# Patient Record
Sex: Female | Born: 1970 | Race: White | Hispanic: Yes | Smoking: Never smoker
Health system: Southern US, Community
[De-identification: ages and names within clinical notes are randomized; demographics above are authoritative.]

## PROBLEM LIST (undated history)

## (undated) HISTORY — PX: TUBAL LIGATION: SHX77

---

## 2000-08-12 ENCOUNTER — Ambulatory Visit (HOSPITAL_COMMUNITY): Admission: RE | Admit: 2000-08-12 | Discharge: 2000-08-12 | Payer: Self-pay | Admitting: *Deleted

## 2000-11-14 ENCOUNTER — Inpatient Hospital Stay (HOSPITAL_COMMUNITY): Admission: AD | Admit: 2000-11-14 | Discharge: 2000-11-14 | Payer: Self-pay | Admitting: *Deleted

## 2000-11-26 ENCOUNTER — Inpatient Hospital Stay (HOSPITAL_COMMUNITY): Admission: AD | Admit: 2000-11-26 | Discharge: 2000-11-26 | Payer: Self-pay | Admitting: Obstetrics & Gynecology

## 2000-12-24 ENCOUNTER — Encounter (HOSPITAL_COMMUNITY): Admission: RE | Admit: 2000-12-24 | Discharge: 2000-12-30 | Payer: Self-pay | Admitting: Obstetrics & Gynecology

## 2000-12-29 ENCOUNTER — Inpatient Hospital Stay (HOSPITAL_COMMUNITY): Admission: AD | Admit: 2000-12-29 | Discharge: 2001-01-02 | Payer: Self-pay | Admitting: Obstetrics

## 2000-12-29 ENCOUNTER — Encounter (INDEPENDENT_AMBULATORY_CARE_PROVIDER_SITE_OTHER): Payer: Self-pay

## 2001-01-10 ENCOUNTER — Inpatient Hospital Stay (HOSPITAL_COMMUNITY): Admission: AD | Admit: 2001-01-10 | Discharge: 2001-01-10 | Payer: Self-pay | Admitting: Obstetrics

## 2005-02-18 ENCOUNTER — Encounter: Admission: RE | Admit: 2005-02-18 | Discharge: 2005-02-25 | Payer: Self-pay | Admitting: *Deleted

## 2005-05-02 ENCOUNTER — Ambulatory Visit: Payer: Self-pay | Admitting: *Deleted

## 2005-05-10 ENCOUNTER — Ambulatory Visit: Payer: Self-pay | Admitting: Obstetrics and Gynecology

## 2005-05-10 ENCOUNTER — Inpatient Hospital Stay (HOSPITAL_COMMUNITY): Admission: AD | Admit: 2005-05-10 | Discharge: 2005-05-10 | Payer: Self-pay | Admitting: *Deleted

## 2005-05-13 ENCOUNTER — Inpatient Hospital Stay (HOSPITAL_COMMUNITY): Admission: AD | Admit: 2005-05-13 | Discharge: 2005-05-16 | Payer: Self-pay | Admitting: *Deleted

## 2005-05-13 ENCOUNTER — Encounter (INDEPENDENT_AMBULATORY_CARE_PROVIDER_SITE_OTHER): Payer: Self-pay | Admitting: *Deleted

## 2005-05-13 ENCOUNTER — Ambulatory Visit: Payer: Self-pay | Admitting: *Deleted

## 2010-05-24 ENCOUNTER — Other Ambulatory Visit: Admission: RE | Admit: 2010-05-24 | Discharge: 2010-05-24 | Payer: Self-pay | Admitting: Gynecology

## 2010-05-24 ENCOUNTER — Ambulatory Visit: Payer: Self-pay | Admitting: Gynecology

## 2010-06-06 ENCOUNTER — Encounter: Admission: RE | Admit: 2010-06-06 | Discharge: 2010-06-06 | Payer: Self-pay | Admitting: Gynecology

## 2010-08-22 ENCOUNTER — Emergency Department (HOSPITAL_COMMUNITY)
Admission: EM | Admit: 2010-08-22 | Discharge: 2010-08-22 | Disposition: A | Discharge status: Home / Self Care | Payer: Managed Care, Other (non HMO) | Attending: Emergency Medicine | Admitting: Emergency Medicine

## 2010-08-22 DIAGNOSIS — K219 Gastro-esophageal reflux disease without esophagitis: Secondary | ICD-10-CM | POA: Insufficient documentation

## 2010-08-22 DIAGNOSIS — R141 Gas pain: Secondary | ICD-10-CM | POA: Insufficient documentation

## 2010-08-22 DIAGNOSIS — R1110 Vomiting, unspecified: Secondary | ICD-10-CM | POA: Insufficient documentation

## 2010-08-22 DIAGNOSIS — R142 Eructation: Secondary | ICD-10-CM | POA: Insufficient documentation

## 2010-08-22 DIAGNOSIS — R1033 Periumbilical pain: Secondary | ICD-10-CM | POA: Insufficient documentation

## 2010-08-22 LAB — CBC
HCT: 29.6 % — ABNORMAL LOW (ref 36.0–46.0)
Hemoglobin: 9.5 g/dL — ABNORMAL LOW (ref 12.0–15.0)
MCH: 24.7 pg — ABNORMAL LOW (ref 26.0–34.0)
MCHC: 32.1 g/dL (ref 30.0–36.0)
MCV: 76.9 fL — ABNORMAL LOW (ref 78.0–100.0)
Platelets: 227 K/uL (ref 150–400)
RBC: 3.85 MIL/uL — ABNORMAL LOW (ref 3.87–5.11)
RDW: 13.4 % (ref 11.5–15.5)
WBC: 5.7 K/uL (ref 4.0–10.5)

## 2010-08-22 LAB — BASIC METABOLIC PANEL
BUN: 8 mg/dL (ref 6–23)
Calcium: 8.6 mg/dL (ref 8.4–10.5)
Creatinine, Ser: 0.62 mg/dL (ref 0.4–1.2)
GFR calc non Af Amer: >60 mL/min (ref >60)
Glucose, Bld: 87 mg/dL (ref 70–99)
Sodium: 138 mEq/L (ref 135–145)

## 2010-08-22 LAB — DIFFERENTIAL
Basophils Absolute: 0 10*3/uL (ref 0.0–0.1)
Basophils Relative: 1 % (ref 0–1)
Eosinophils Absolute: 0.1 10*3/uL (ref 0.0–0.7)
Eosinophils Relative: 2 % (ref 0–5)
Lymphocytes Relative: 32 % (ref 12–46)
Lymphs Abs: 1.8 10*3/uL (ref 0.7–4.0)
Monocytes Absolute: 0.5 10*3/uL (ref 0.1–1.0)
Monocytes Relative: 8 % (ref 3–12)
Neutro Abs: 3.3 10*3/uL (ref 1.7–7.7)
Neutrophils Relative %: 57 % (ref 43–77)

## 2010-08-22 LAB — URINALYSIS, ROUTINE W REFLEX MICROSCOPIC
Bilirubin Urine: NEGATIVE
Ketones, ur: NEGATIVE mg/dL
Leukocytes, UA: NEGATIVE
Nitrite: NEGATIVE
Protein, ur: NEGATIVE mg/dL
Specific Gravity, Urine: 1.011 (ref 1.005–1.030)
Urine Glucose, Fasting: NEGATIVE mg/dL
Urobilinogen, UA: 0.2 mg/dL (ref 0.0–1.0)
pH: 6.5 (ref 5.0–8.0)

## 2010-08-22 LAB — POCT PREGNANCY, URINE: Preg Test, Ur: NEGATIVE

## 2010-08-22 LAB — URINE MICROSCOPIC-ADD ON

## 2010-08-30 ENCOUNTER — Other Ambulatory Visit: Payer: Self-pay | Admitting: Gastroenterology

## 2010-09-04 ENCOUNTER — Other Ambulatory Visit: Payer: Managed Care, Other (non HMO)

## 2010-09-05 ENCOUNTER — Ambulatory Visit
Admission: RE | Admit: 2010-09-05 | Discharge: 2010-09-05 | Disposition: A | Discharge status: Home / Self Care | Payer: Managed Care, Other (non HMO) | Source: Ambulatory Visit | Attending: Gastroenterology | Admitting: Gastroenterology

## 2010-09-06 ENCOUNTER — Other Ambulatory Visit: Payer: Self-pay | Admitting: Gastroenterology

## 2010-09-11 ENCOUNTER — Ambulatory Visit
Admission: RE | Admit: 2010-09-11 | Discharge: 2010-09-11 | Disposition: A | Discharge status: Home / Self Care | Payer: Managed Care, Other (non HMO) | Source: Ambulatory Visit | Attending: Gastroenterology | Admitting: Gastroenterology

## 2010-09-11 MED ORDER — IOHEXOL 300 MG/ML  SOLN
100.0000 mL | Freq: Once | INTRAMUSCULAR | Status: AC | PRN
  Administered 2010-09-11: 100 mL via INTRAVENOUS

## 2010-11-22 NOTE — Discharge Summary (Signed)
[facility]  Patient:    Yvonne Romero, Yvonne Romero                         MRN: [id number] Adm. Date:  [id number] Disc. Date: [id number] Attending:  Tammi Sou Dictator:   Marvis Moeller, CHM                           Discharge Summary  ADMISSION DIAGNOSES:          Prima gravida at 66 3/7 weeks intrauterine pregnancy.  DISCHARGE DIAGNOSES:          Primary low transverse cesarean section, viable female weighing 8 pounds 11 ounces, Apgars 8/1 and 9/5.  HISTORY:                      This is a 40 year old prima gravida who came in with question of leakage of fluid.  However, she did not report a gush.  Her contractions were nonpainful and mild.  [facility] and was receiving fetal assessment tests for post dates pregnancy.  Her pregnancy course was essentially uncomplicated.  Of note, her one hour glucola was 103. Her GBS was negative.  Her cultures for GC and chlamydia were both negative. Her baseline hemoglobin was 12.5.  She was O+ blood type.  PAST OBSTETRICAL HISTORY:     Prima gravida.  PAST GYNECOLOGICAL HISTORY:   Negative history for STIs.  PAST MEDICAL HISTORY:         Noncontributory.  FAMILY HISTORY:               Noncontributory.  SOCIAL HISTORY:               Non-English speaking.  Education only up to fourth grade.  Unemployed.  Married and has social support.  Negative tobacco, alcohol, drugs.  PHYSICAL EXAMINATION  VITAL SIGNS:                  Patient was noted to be afebrile with vital signs stable.  ABDOMEN:                      Gravid, soft, nontender.  PELVIC:                       Cervix was 2, 80%, -2, and vertex per Dr. Clearance Coots.  SKIN:                         Of note, she had a mottled rash on her arms and groin.  This was pruritic.  LABORATORIES:                 Nitrazine and fern testing were both negative.  HOSPITAL COURSE:              It was felt that she was in latent phase labor and  the decision was made to admit for Pitocin augmentation/induction of labor.  She was also given IV Solu-Medrol to be followed by an oral steroid taper for a rash which was thought to be PUPPPS.  Active stage of labor was somewhat protracted on Pitocin up to about 5 mg.  The patient did receive an epidural when she was approximately 6 cm.  She had an IUPC and scalp lead placed.  Positioning was felt to be ROP.  Amniotic fluid was clear following AROM which was done by Dr. Cyndia Bent when she was 3 cm.  She remained comfortable.  Fetal heart rate was reassuring with a negative CST.  Her active phase progress was fairly slow and this was attributed to her ROP positioning, the estimated fetal weight of 8 pounds, and the questionable adequacy of labor measured by [place] units which were 180 through much of second stage.  Her second stage lasted 3 hours and 20 minutes duration.  Dr. Gavin Potters was consulted when the patient was complete and confirmed her posterior positioning and the plan was discussed at 6 a.m. concerning the second stage protraction versus arrest.  The decision was made to proceed with a primary low transverse cesarean section due to arrest of descent, prolongation of second stage, macrosomia with fetus weighing 8 pounds 11 ounces.  Her postoperative course was normal.  She was given perioperative Unasyn due to her risk of prolonged labor and possible endometritis.  Her PUPPPS rash improved greatly on the Medrol dose pack.  On hospital day #3 she was doing well, ambulatory without orthostatic symptoms.  Lochia was tapering. Breast-feeding well.  Voiding q.s.  Tolerating a regular diet.  She had had flatus.  PHYSICAL EXAMINATION  VITAL SIGNS:                  Temperature was 97.9, pulse 72, blood pressure 124/80.  SKIN:                         Her rash had resolved.  BREASTS:                      Her breasts were filling.  ABDOMEN:                      Soft and appropriately  tender with fundus firm well below umbilicus.  Incision was clean, dry, and intact.  Staples were removed.  LABORATORIES:                 Her admission hemoglobin and hematocrit had been 9.5/32.3.  On discharge her hemoglobin was 8.7, hematocrit 23.6.  RPR was negative.  The placenta was sent for pathology and revealed meconium stained placenta with chorioamnionitis and ______ present.  DISCHARGE MEDICATIONS:        1. Prenatal vitamins one q.d.                               2. Iron supplement one p.o. q.d.                               3. Ibuprofen 600 mg q.h.s. p.r.n. pain.  FOLLOW-UP:                    [facility]  She was to get her definitive [facility] control regimen [facility] and meanwhile was to breast-feed and to use foam or condoms. DD:  02/09/01 TD:  02/09/01 Job: [id number]

## 2010-11-22 NOTE — Discharge Summary (Signed)
NAMEANGELLE, Yvonne Romero                ACCOUNT NO.:  [id number]   MEDICAL RECORD NO.:  [id number]          PATIENT TYPE:  INP   LOCATION:  9109                          FACILITY:  [facility]   PHYSICIAN:  Yvonne Romero, M.D.     DATE OF BIRTH:  1971/03/01   DATE OF ADMISSION:  05/13/2005  DATE OF DISCHARGE:                                 DISCHARGE SUMMARY   REASON FOR ADMISSION:  Scheduled repeat Cesarean section.   DISCHARGE DIAGNOSES:  1.  Status post repeat low transverse Cesarean section and postpartum tubal      ligation.  2.  Gestational diabetes, diet controlled.   LABORATORY DATA:  Hemoglobin 13.7, postoperative day #1 hemoglobin 12.  RPR  nonreactive.   HOSPITAL COURSE:  The patient is  40 year old G2, P1-0-0-1 at 37-4/7 weeks  who presented for scheduled repeat Cesarean section.  She had her first  Cesarean section because the baby had failure to descend and macrosomia.  Repeat Cesarean section was performed without complications.  Please see  operative note for full details. A viable female infant was born with Apgar's  of 8 and 9.  The patient had normal tubes and ovaries.  The postoperative  course was unremarkable.  Pain was well controlled with Percocet.  Hemoglobin was stable.  Staples were discontinued on the day of discharge.   DISPOSITION:  Home in stable condition.   FOLLOW UP:  Six weeks at [facility].   DISCHARGE MEDICATIONS:  1.  Percocet 5 one to two p.o. q.4-6h. PRN.  2.  Ibuprofen 600 mg q.6h. PRN.  3.  Prenatal vitamins one tab p.o. daily.  4.  Simethicone 80 mg one tab p.o. q.6h. PRN bloating.   ACTIVITY:  No heavy lifting X6 weeks.  Nothing per vagina X6 weeks.   DISCHARGE INSTRUCTIONS:  The patient is to return with any signs of  infection including fever, chills, night sweats, increased vaginal bleeding.     ______________________________  Marc Morgans. Mayford Knife, M.D.    ______________________________  Javier Glazier. Okey Romero, M.D.   TLW/MEDQ  D:  05/16/2005   T:  05/16/2005  Job:  [id number]

## 2010-11-22 NOTE — Op Note (Signed)
NAMECHELBI, HERBER                ACCOUNT NO.:  [id number]   MEDICAL RECORD NO.:  [id number]          PATIENT TYPE:  INP   LOCATION:  9109                          FACILITY:  [facility]   PHYSICIAN:  Conni Elliot, M.D.DATE OF BIRTH:  1970/09/02   DATE OF PROCEDURE:  05/13/2005  DATE OF DISCHARGE:                                 OPERATIVE REPORT   PREOPERATIVE DIAGNOSES:  1.  Intrauterine pregnancy, 37-4/7 weeks.  2.  History of previous cesarean section.  3.  Gestational diabetes, diet controlled.  4.  Postpartum tubal ligation.   POSTOPERATIVE DIAGNOSES:  1.  Intrauterine pregnancy, 37-4/7 weeks.  2.  History of previous cesarean section.  3.  Gestational diabetes, diet controlled.  4.  Postpartum tubal ligation.   OPERATION/PROCEDURE:  1.  Repeat low transverse cesarean section via Pfannenstiel.  2.  Postpartum tubal ligation.   SURGEON:  Conni Elliot, M.D.   ASSISTANT:  Marc Morgans. Mayford Knife, M.D.   ANESTHESIA:  Spinal.   COMPLICATIONS:  None.   ESTIMATED BLOOD LOSS:  2900 mL lactated Ringer's.   URINARY OUTPUT:  200 mg of clear urine.   INDICATIONS:  The patient is a 40 year old gravida 2, para 1-0-0-1 at 37-4/7  weeks who presents for a scheduled repeat cesarean section.  She has been  counseled on the risks and benefits of VBAC versus repeat cesarean section  and wanted to proceed with cesarean section, partly because her last baby  she had CPD and failure to progress.  The only weighed in the 6-pound range.  The patient is also a diet-controlled diabetic.   FINDINGS:  Female infant in the cephalic presentation.  NICU was present at  delivery.  Thin meconium.  Apgars 8 and 9.  Normal uterus, tubes and  ovaries.   DESCRIPTION OF PROCEDURE:  The patient was taken to the operating room where  spinal anesthesia was found to be adequate.  She was then prepped and draped  in the normal sterile fashion in the dorsal supine position with a leftward  tilt.  A  Pfannenstiel skin incision was then made with the scalpel and  carried through to the underlying layer of fascia.  The fascia was nicked in  the midline and the incision extended laterally with the Mayo scissors.  The  superior aspect of the fascial incision was then grasped with the Kocher  clamps, elevated and the rectus muscles dissected off using the Mayo  scissors.  Next, the __________  separated and the peritoneal cavity was  entered bluntly and then extended with the Mayo scissors.  The peritoneum  was grasped and the Metzenbaum scissors were used to create a bladder flap.  The lower uterine incised in transverse fashion with the scalpel and  extended out laterally manually with the bandage scissors.  Bladder blade  was removed and the infant's head and body delivered atraumatically.  The  nose and mouth were DeLee suctioned. When the membranes were ruptured, there  was noted to be light meconium.  Cord was clamped and cut and the infant was  handed to the waiting neonatologist.  The placenta was removed manually by  massaging the external side of the uterus.  The uterus was exteriorized and  cleared of all clots and debris.  The uterus was closed using #1 chromic on  the CT1, then the same layer was also done using the same suture.  There is  excellent hemostasis.  The bladder flap was repaired using 2-0 chromic on a  SH1.  Gutters were cleared of all clots and debris.   Attention was then turned to the fallopian tubes.  The right fallopian tube  was grasped with a Babcock and SH needle was superior to the fallopian tube  and then it was used to tie an appropriate 2 cm knuckle of the fallopian  tube.  Next, the same suture was used to tie just inferior to the same  suture. Metzenbaum scissors were then used to excise that portion of the  fallopian tube.  Excellent hemostasis was done and was noted on that side.  Attention was then turned to the left side and in similar fashion.   All the  same steps were performed without difficulty.   The uterus was then returned to the abdominal cavity.  Once again gutters  were cleared of all clots and there was excellent hemostasis noted.  The  fascia was reapproximated.  The peritoneum was closed with 2-0 chromic in a  running fashion.  The fascia was closed with 0 Vicryl.  Next, the  subcutaneous tissue was closed with 0 plain.  The skin was closed with  staples.   The patient tolerated the procedure well.  Sponge, lap and needle counts  were correct x2.  Antibiotics were given at cord clamp.  The patient was  taken to the recovery room in stable condition.  There were no  complications.     ______________________________  Marc Morgans Mayford Knife, M.D.    ______________________________  Conni Elliot, M.D.    TLW/MEDQ  D:  05/14/2005  T:  05/15/2005  Job:  [id number]

## 2010-11-22 NOTE — Op Note (Signed)
[facility]  Patient:    Yvonne Romero, Yvonne Romero                         MRN: [id number] Proc. Date: 12/30/00 Adm. Date:  [id number] Attending:  Tammi Sou Dictator:   Jamey Reas, M.D.                           Operative Report  DATE OF BIRTH:                05-11-1971  PREOPERATIVE DIAGNOSES:       1. Term intrauterine pregnancy.                               2. Failed induction.                               3. Arrestive descent.                               4. Chorioamnionitis.                               5. Prolonged second stage of labor.  POSTOPERATIVE DIAGNOSES:      1. Term intrauterine pregnancy.                               2. Failed induction.                               3. Arrestive descent.                               4. Chorioamnionitis.                               5. Prolonged second stage of labor.  OPERATION:                    Primary low transverse cesarean section via                               Pfannenstiel.  SURGEON:                      Conni Elliot, M.D.  ASSISTANT:                    Jamey Reas, M.D.  ANESTHESIA:  FINDINGS:                     Female infant, cephalic presentation, OP position, clear fluid.  Apgars 8 at one minute and 9 at five minutes.  Cord pH 7.27. Weight 8 pounds and 11 ounces.  IV FLUIDS:                    2600 cc.  ESTIMATED BLOOD LOSS:         600 cc.  URINE OUTPUT:                 350 cc.  COMPLICATIONS:                None.  CONDITION:                    Stable.  INDICATIONS:                  A 40 year old gravida 1, para 0, at 40 weeks, induced for post dates and favorable cervix.  Maximum dilatation complete.  DESCRIPTION OF PROCEDURE:     The patient was taken to the operating room where epidural anesthesia was found to be adequate.  She was then prepped and draped in a normal sterile fashion in the dorsal supine position with  leftward tilt.  A Pfannenstiel skin incision was then made with the scalpel and carried through to the underlying layer of fascia.  The fascia was incised in the midline and the incision extended laterally with Mayo scissors.  The superior aspect of the fascial incision was then grasped with Kocher clamps, elevated, and the underlying rectus muscles dissected off bluntly.  Attention was then turned to the inferior aspect of this incision which in a similar fashion was grasped, tinted up with Kocher clamps, and the rectus muscles dissected off bluntly.  The rectus muscles were then separated in the midline, the peritoneum identified, tinted up, and entered sharply with Metzenbaum scissors.  The peritoneal incision was then extended superiorly and inferiorly with good visualization of the bladder.  The bladder blade was then inserted and the vesicouterine peritoneum identified and grasped with pickups and entered sharply with Metzenbaum scissors.  This incision was then extended laterally and the bladder flap created digitally.  The bladder blade was then reinserted and the lower uterine segment incised in a transverse fashion with the scalpel.  The uterine incision was then extended laterally with blunt dissection.  The bladder blade was removed and the infants head delivered atraumatically.  The nose and mouth were suctioned with bulb suction and cord clamped and cut.  The infant was handed off to awaiting pediatricians.  Cord gases were sent.  The placenta was then removed manually, the uterus exteriorized, and cleared of all clots and debris.  The uterine incision was repaired with 1-0 chromic in a running locked fashion.  A second layer of the same suture was used to obtain excellent hemostasis.  The bladder flap was repaired with 3-0 Vicryl in a running stitch, and the uterus returned to the abdomen.  The gutters were cleared of all clots and debris.  The peritoneum closed with 2-0  chromic.  The fascia was reapproximated with 3-0 Vicryl in a running fashion.  The skin was closed with staples.  The patient tolerated the procedure well.  Sponge, lap, and needle counts were correct x 2.  ________ had been given prior to surgery.  The patient was taken to the recovery room in stable condition. DD:  12/30/00 TD:  12/30/00 Job: [id number]

## 2011-07-14 ENCOUNTER — Encounter: Payer: Self-pay | Admitting: Emergency Medicine

## 2011-07-14 ENCOUNTER — Emergency Department (HOSPITAL_COMMUNITY)
Admission: EM | Admit: 2011-07-14 | Discharge: 2011-07-14 | Disposition: A | Discharge status: Home / Self Care | Payer: Managed Care, Other (non HMO) | Attending: Emergency Medicine | Admitting: Emergency Medicine

## 2011-07-14 DIAGNOSIS — S335XXA Sprain of ligaments of lumbar spine, initial encounter: Secondary | ICD-10-CM | POA: Insufficient documentation

## 2011-07-14 DIAGNOSIS — X58XXXA Exposure to other specified factors, initial encounter: Secondary | ICD-10-CM | POA: Insufficient documentation

## 2011-07-14 DIAGNOSIS — M79609 Pain in unspecified limb: Secondary | ICD-10-CM | POA: Insufficient documentation

## 2011-07-14 DIAGNOSIS — M545 Low back pain, unspecified: Secondary | ICD-10-CM | POA: Insufficient documentation

## 2011-07-14 DIAGNOSIS — S39012A Strain of muscle, fascia and tendon of lower back, initial encounter: Secondary | ICD-10-CM

## 2011-07-14 LAB — URINALYSIS, ROUTINE W REFLEX MICROSCOPIC
Bilirubin Urine: NEGATIVE
Glucose, UA: NEGATIVE mg/dL
Hgb urine dipstick: NEGATIVE
Ketones, ur: NEGATIVE mg/dL
Leukocytes, UA: NEGATIVE
Nitrite: NEGATIVE
Protein, ur: NEGATIVE mg/dL
Specific Gravity, Urine: 1.017 (ref 1.005–1.030)
Urobilinogen, UA: 1 mg/dL (ref 0.0–1.0)
pH: 7 (ref 5.0–8.0)

## 2011-07-14 LAB — PREGNANCY, URINE: Preg Test, Ur: NEGATIVE

## 2011-07-14 MED ORDER — ORPHENADRINE CITRATE ER 100 MG PO TB12: 100.0000 mg | ORAL_TABLET | Freq: Two times a day (BID) | ORAL | Status: AC

## 2011-07-14 MED ORDER — NAPROXEN 500 MG PO TABS: 500.0000 mg | ORAL_TABLET | Freq: Two times a day (BID) | ORAL | Status: DC

## 2011-07-14 MED ORDER — OXYCODONE-ACETAMINOPHEN 5-325 MG PO TABS: 1.0000 | ORAL_TABLET | ORAL | Status: AC | PRN

## 2011-07-14 NOTE — ED Provider Notes (Signed)
History     CSN: [id number]  Arrival date & time 07/14/11  1547   First MD Initiated Contact with Patient 07/14/11 1847      Chief Complaint  Patient presents with  . Back Pain    (Consider location/radiation/quality/duration/timing/severity/associated sxs/prior treatment) Patient is a 41 y.o. female presenting with back pain. The history is provided by the patient.  Back Pain   She has been having pain in her lower back for the last 4 days. Pain is dull and achy and severe. She rates pain at 10 out of 10. It is worse with the movement. Nothing makes it feel any better. She's not taken any medication for it. Pain does radiate up to her upper back and somewhat into her left leg. She denies weakness, numbness, tingling. She denies any urinary urgency, frequency, tenesmus, or dysuria. There's been no fever, chills, sweats and she denies nausea or vomiting. Her job does involve a lot of lifting including some awkward lifting. She does not recall a specific injury occurring on the job. She has not had back pain previously.  History reviewed. No pertinent past medical history.  History reviewed. No pertinent past surgical history.  History reviewed. No pertinent family history.  History  Substance Use Topics  . Smoking status: Never Smoker   . Smokeless tobacco: Not on file  . Alcohol Use: No    OB History    Grav Para Term Preterm Abortions TAB SAB Ect Mult Living                  Review of Systems  Musculoskeletal: Positive for back pain.  All other systems reviewed and are negative.    Allergies  Review of patient's allergies indicates no known allergies.  Home Medications  No current outpatient prescriptions on file.  BP 105/70  Pulse 66  Temp(Src) 98.2 F (36.8 C) (Oral)  Resp 18  SpO2 99%  Physical Exam  Nursing note and vitals reviewed.  41 year old female who is resting comfortably and in no acute distress. Vital signs are normal. Oxygen saturation is 99%  which is normal. Head is normocephalic and atraumatic. Oropharynx is clear. Neck is supple without adenopathy. Back has mild tenderness throughout the lumbar area with moderate bilateral paralumbar spasm which is worse on the left. Straight leg raise is negative. Lungs are clear without rales, wheezes, rhonchi. Heart has regular rate and rhythm without murmur. There is no chest wall tenderness. Abdomen is soft, flat, nontender without masses or hepatosplenomegaly. Extremities have no cyanosis or edema, full range of motion present. Skin is warm and moist without rash. Neurologic: Mental status is normal, cranial nerves are intact, there no focal motor or sensory deficits. Psychiatric: No abnormalities of mood or affect.  ED Course  Procedures (including critical care time)   Labs Reviewed  URINALYSIS, ROUTINE W REFLEX MICROSCOPIC  PREGNANCY, URINE   No results found. Results for orders placed during the hospital encounter of 07/14/11  URINALYSIS, ROUTINE W REFLEX MICROSCOPIC      Component Value Range   Color, Urine YELLOW  YELLOW    APPearance HAZY (*) CLEAR    Specific Gravity, Urine 1.017  1.005 - 1.030    pH 7.0  5.0 - 8.0    Glucose, UA NEGATIVE  NEGATIVE (mg/dL)   Hgb urine dipstick NEGATIVE  NEGATIVE    Bilirubin Urine NEGATIVE  NEGATIVE    Ketones, ur NEGATIVE  NEGATIVE (mg/dL)   Protein, ur NEGATIVE  NEGATIVE (mg/dL)   Urobilinogen,  UA 1.0  0.0 - 1.0 (mg/dL)   Nitrite NEGATIVE  NEGATIVE    Leukocytes, UA NEGATIVE  NEGATIVE   PREGNANCY, URINE      Component Value Range   Preg Test, Ur NEGATIVE     No results found.    No diagnosis found.  Impression-musculoskeletal low back pain. She will be sent home with prescriptions for naproxen, Norflex, and Percocet.  MDM  Musculoskeletal back pain. She will be treated with NSAIDs, muscle relaxers, and narcotic pain medication. Urine pregnancy test will be checked prior to prescribing medication.        Dione Booze,  MD 07/14/11 [phone]

## 2011-07-14 NOTE — ED Notes (Signed)
Pt c/o lower back pain x 4 days; pt denies injury or burning with urinatino

## 2012-01-30 IMAGING — CT CT ABD-PEL WO/W CM
2 of 7 series · 14 of 46 positions shown, 19 images · IV contrast (READICAT/WATER & [ID] OMNI 300)
Comparison: 09/05/2010, 06/06/2010

CLINICAL DATA: Abdominal pain.  Kidney stones.  Left-sided
abdominal pain and left-sided back pain.

CT ABDOMEN AND PELVIS WITHOUT AND WITH CONTRAST
TECHNIQUE: Multidetector CT imaging of the abdomen and pelvis was
performed without contrast material in one or both body regions,
followed by contrast material(s) and further sections in one or
both body regions.
Contrast: 100 ml Cmnipaque-KNN.

[Series 4: abdomen w/ · axial · 0.70mm/px · z∈[-353,-23]mm · 11 of 76 slices shown, 16 images]
[im 5/76  soft-tissue]
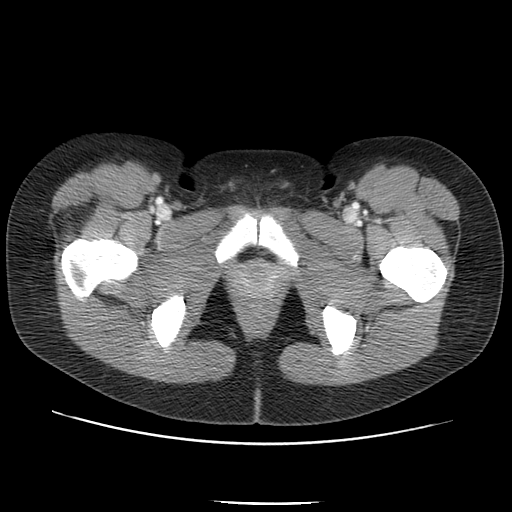
[im 5/76  bone]
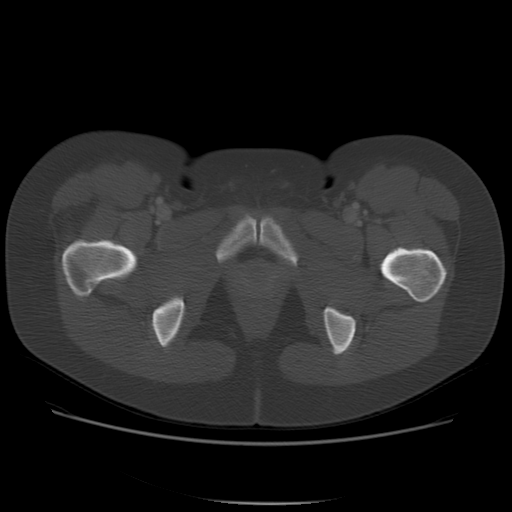
[im 15/76  soft-tissue]
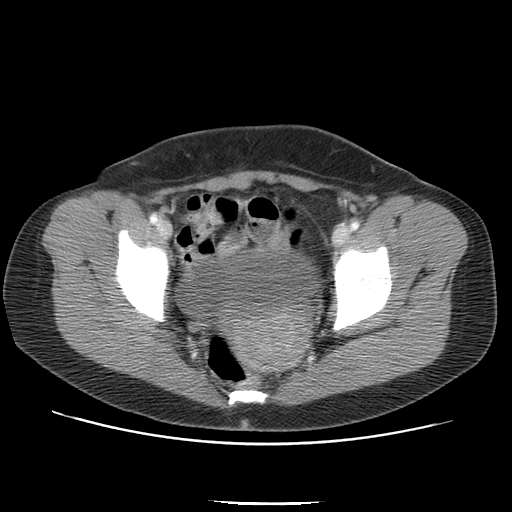
[im 19/76  soft-tissue]
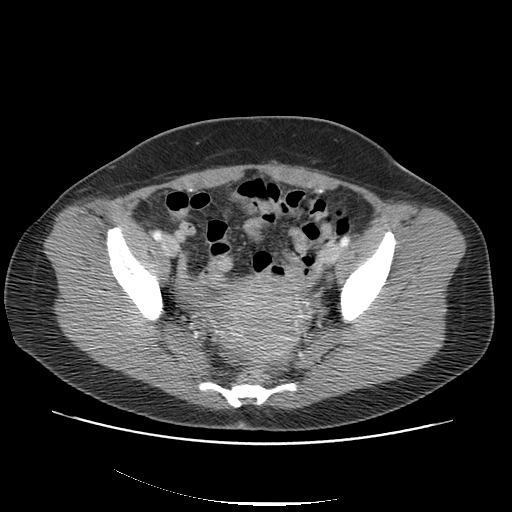
[im 29/76  soft-tissue]
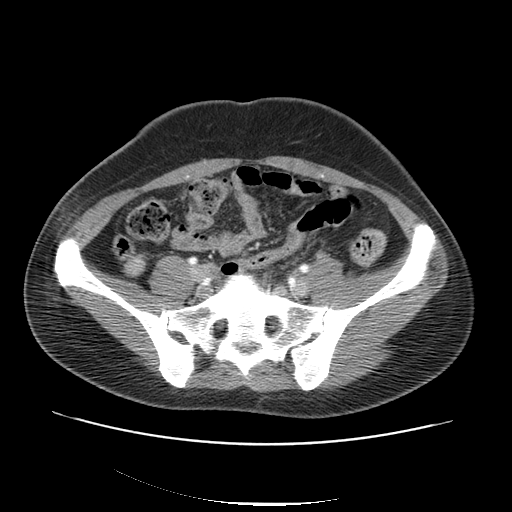
[im 33/76  soft-tissue]
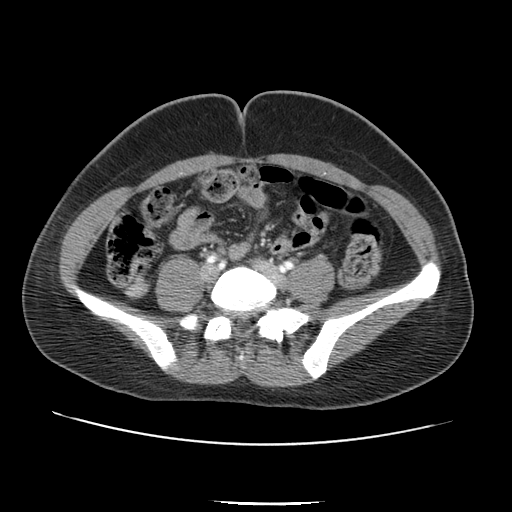
[im 43/76  soft-tissue]
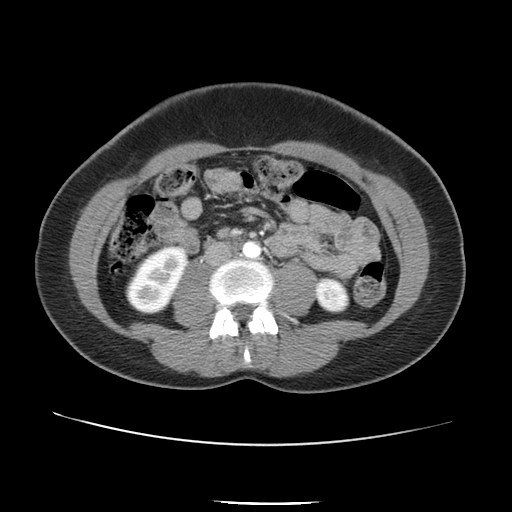
[im 47/76  soft-tissue]
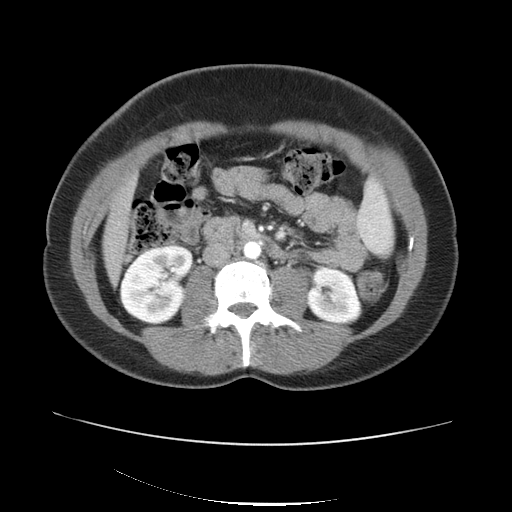
[im 57/76  soft-tissue]
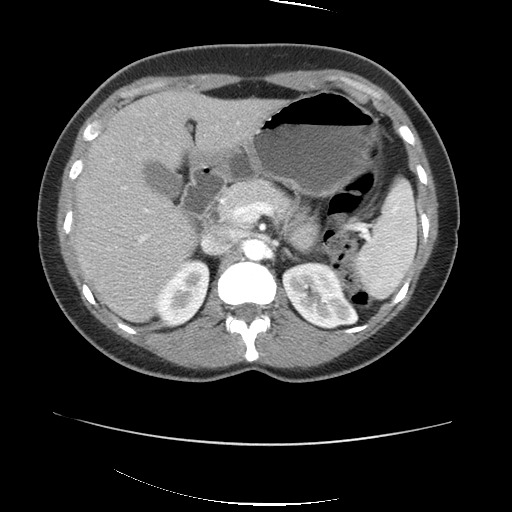
[im 57/76  lung]
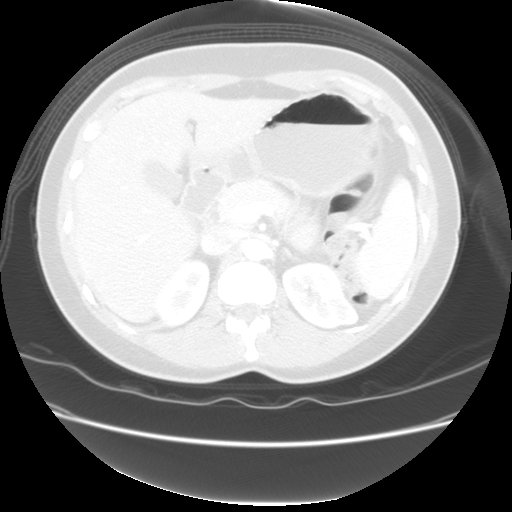
[im 61/76  soft-tissue]
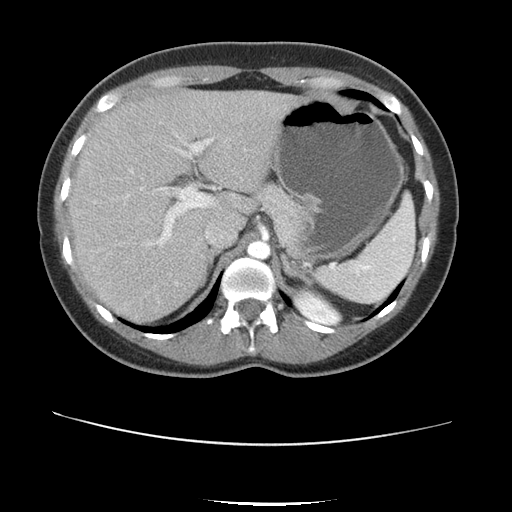
[im 61/76  lung]
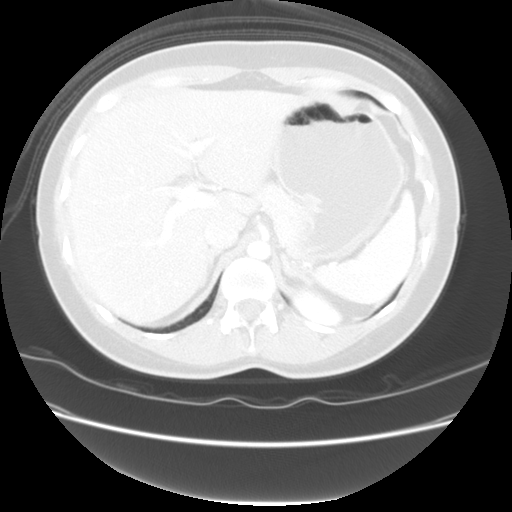
[im 61/76  bone]
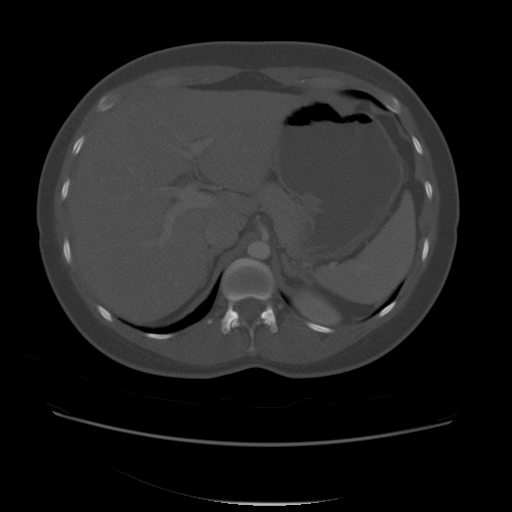
[im 66/76  lung]
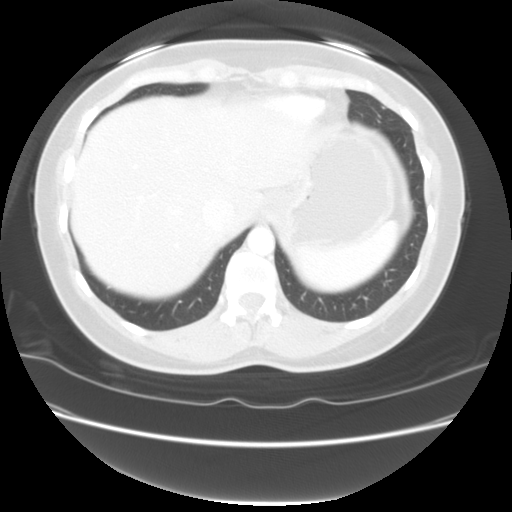
[im 71/76  soft-tissue]
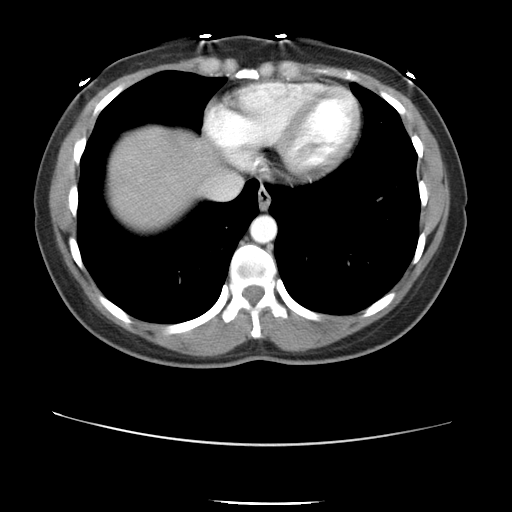
[im 71/76  lung]
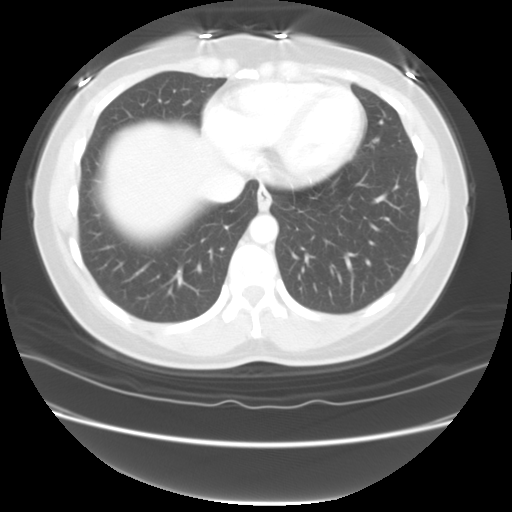

[Series 600: cor w/ cm · coronal · 0.82mm/px · 3 of 104 slices shown]
[im 26/104  soft-tissue]
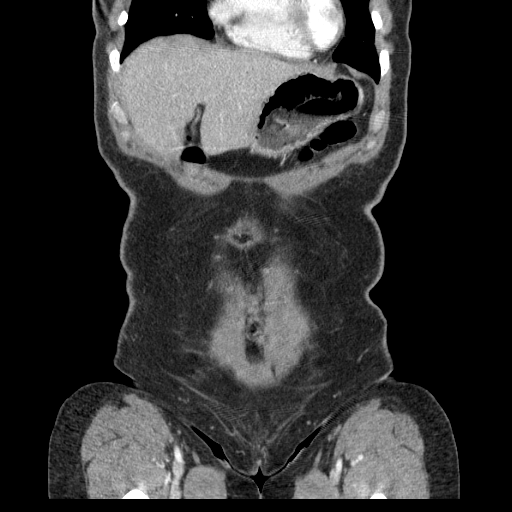
[im 52/104  soft-tissue]
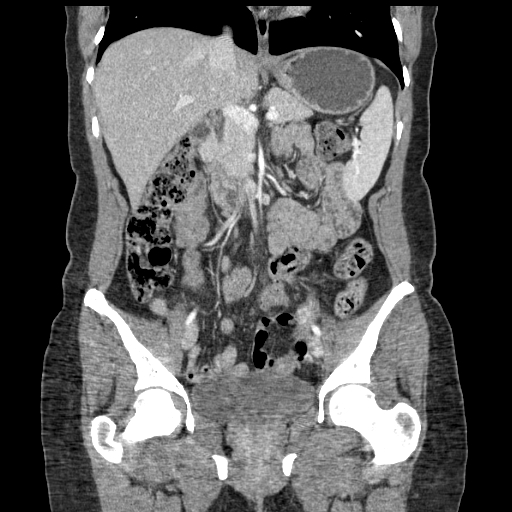
[im 78/104  soft-tissue]
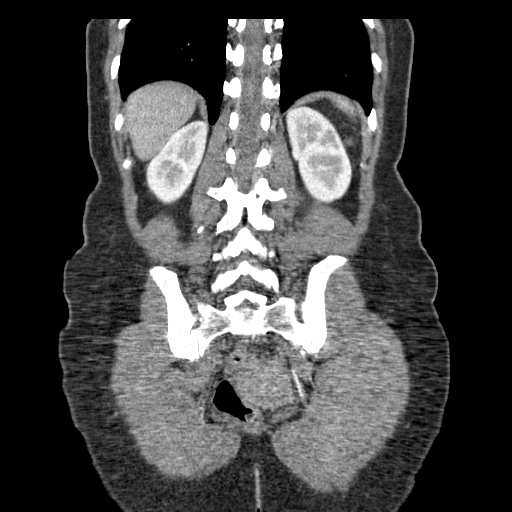

[14 of 46 positions shown; findings below may reference images not displayed]

FINDINGS: The lung bases are clear.  Unenhanced CT demonstrates
three renal calculi within the right collecting system.  Upper pole
calculus measures 3 mm.  Interpolar calculus measures 4 mm and the
inferior pole calculus measures 3 mm.  There are no ureteral
calculi. No left renal calculi are present.  No hydronephrosis.
There is a 2.2 cm simple left interpolar renal cyst.  This appears
unchanged compared to ultrasound 06/06/2010 and 09/05/2010.

The liver appears within normal limits.  Gallbladder is
unremarkable.  Common bile duct and pancreas normal.  Spleen
normal.  Stomach and proximal small bowel within normal limits.
Normal appendix identified adjacent to the cecum.  Physiologic
appearance of the uterus and adnexa. Colon appears normal.  Urinary
bladder appears normal.  Abdominal vasculature is within normal
limits.  Adrenal glands are unremarkable.  Iliofemoral vasculature
appears normal as well.  No lymphadenopathy.  Bones are
unremarkable.
IMPRESSION: 1.  Three nonobstructing right renal collecting system calculi
measuring between 3 and 4 mm each.
2.  Left interpolar simple renal cyst.

## 2012-02-10 ENCOUNTER — Telehealth: Payer: Self-pay | Admitting: *Deleted

## 2012-02-10 ENCOUNTER — Encounter: Payer: Self-pay | Admitting: Gynecology

## 2012-02-10 ENCOUNTER — Ambulatory Visit (INDEPENDENT_AMBULATORY_CARE_PROVIDER_SITE_OTHER): Payer: Managed Care, Other (non HMO) | Admitting: Gynecology

## 2012-02-10 VITALS — BP 118/74 | Ht 60.0 in | Wt 132.0 lb

## 2012-02-10 DIAGNOSIS — R635 Abnormal weight gain: Secondary | ICD-10-CM

## 2012-02-10 DIAGNOSIS — R490 Dysphonia: Secondary | ICD-10-CM

## 2012-02-10 DIAGNOSIS — E079 Disorder of thyroid, unspecified: Secondary | ICD-10-CM

## 2012-02-10 DIAGNOSIS — M542 Cervicalgia: Secondary | ICD-10-CM

## 2012-02-10 DIAGNOSIS — Z01419 Encounter for gynecological examination (general) (routine) without abnormal findings: Secondary | ICD-10-CM

## 2012-02-10 DIAGNOSIS — Z833 Family history of diabetes mellitus: Secondary | ICD-10-CM

## 2012-02-10 LAB — CBC WITH DIFFERENTIAL/PLATELET
Basophils Absolute: 0 10*3/uL (ref 0.0–0.1)
Basophils Relative: 1 % (ref 0–1)
Eosinophils Relative: 1 % (ref 0–5)
HCT: 32.3 % — ABNORMAL LOW (ref 36.0–46.0)
Hemoglobin: 10.6 g/dL — ABNORMAL LOW (ref 12.0–15.0)
MCHC: 32.8 g/dL (ref 30.0–36.0)
MCV: 72.7 fL — ABNORMAL LOW (ref 78.0–100.0)
Monocytes Absolute: 0.5 10*3/uL (ref 0.1–1.0)
Monocytes Relative: 8 % (ref 3–12)
RDW: 15.4 % (ref 11.5–15.5)

## 2012-02-10 LAB — THYROID PANEL WITH TSH
T4, Total: 11.1 ug/dL (ref 5.0–12.5)
TSH: 1.789 u[IU]/mL (ref 0.350–4.500)

## 2012-02-10 LAB — GLUCOSE, RANDOM: Glucose, Bld: 88 mg/dL (ref 70–99)

## 2012-02-10 NOTE — Telephone Encounter (Signed)
[name] CMA is calling to inform patient that appt is set up for 02/12/12 @ 9:15 @ [facility] ([facility]

## 2012-02-10 NOTE — Patient Instructions (Addendum)
Mantenimiento de la salud en las mujeres (Health Maintenance, Females) Un estilo de vida saludable y los cuidados preventivos pueden favorecer la salud y el bienestar.   Haga exmenes regulares de la salud en general, dentales y de los ojos.   Consuma una dieta saludable. Los alimentos como vegetales, frutas, granos enteros, productos lcteos descremados y protenas magras contienen los nutrientes que usted necesita sin necesidad de consumir muchas caloras. Disminuya el consumo de alimentos con alto contenido de grasas slidas, azcar y sal agregadas. Si es necesario, pdaleinformacin acerca de una dieta adecuada a su mdico.   La actividad fsica regular es una de las cosas ms importantes que puede hacer por su salud. Los adultos deben hacer al menos 150 minutos de ejercicios de intensidad moderada (cualquier actividad que aumente la frecuencia cardaca y lo haga transpirar) cada semana. Adems, la mayora de los adultos necesita ejercicios de fortalecimiento muscular 2  ms das por semana.    Mantenga un peso saludable. El ndice de masa corporal (IMC) es una herramienta que identifica posibles problemas con el peso. Proporciona una estimacin de la grasa corporal basndose en el peso y la altura. El mdico podr determinar su IMC y podr ayudarlo a lograr o mantener un peso saludable. Para los adultos de 20 aos o ms:   Un IMC menor a 18,5 se considera bajo peso.   Un IMC entre 18,5 y 24,9 es normal.   Un IMC entre 25 y 29,9 es sobrepeso.   Un IMC entre 30 o ms es obesidad.   Mantenga un nivel normal de lpidos y colesterol en sangre practicando actividad fsica y minimizando la ingesta de grasas saturadas. Consuma una dieta balanceada e incluya variedad de frutas y vegetales. Los anlisis de lpidos y colesterol en sangre deben comenzar a los 20 aos y repetirse cada 5 aos. Si los niveles de colesterol son altos, tiene ms de 50 aos o tiene riesgo elevado de sufrir enfermedades  cardacas, necesitar controlarse con ms frecuencia.Si tiene niveles elevados de lpidos y colesterol, debe recibir tratamiento con medicamentos, si la dieta y el ejercicio no son efectivos.   Si fuma, consulte con el profesional acerca de las opciones para dejar de hacerlo. Si no lo hace, no comience.   Si est embarazada no beba alcohol. Si est amamantando, beba alcohol con prudencia. Si elige beber alcohol, no se exceda de 1 medida por da. Se considera una medida a 12 onzas (355 ml) de cerveza, 5 onzas (148 ml) de vino, o 1,5 onzas (44 ml) de licor.   Evite el alcohol y el consumo de drogas. No comparta agujas. Pida ayuda si necesita asistencia o instrucciones con respecto a abandonar el consumo de alcohol, cigarrillos o drogas.   La hipertensin arterial causa enfermedades cardacas y aumenta el riesgo de ictus. Debe controlar su presin arterial al menos cada 1 o 2 aos. La presin arterial elevada que persiste debe tratarse con medicamentos si la prdida de peso y el ejercicio no son efectivos.   Si tiene entre 55 y 79 aos, consulte a su mdico si debe tomar aspirina para prevenir enfermedades cardacas.   Los anlisis para la diabetes incluyen la toma de una muestra de sangre para controlar el nivel de azcar en la sangre durante el ayuno. Debe hacerlo cada 3 aos despus de los 45 aos si est dentro de su peso normal y sin factores de riesgo para la diabetes. Las pruebas deben comenzar a edades tempranas o llevarse a cabo con ms frecuencia   si tiene sobrepeso y al menos 1 factor de riesgo para la diabetes.   Las evaluaciones para detectar el cncer de mama son un mtodo preventivo fundamental para las mujeres. Debe practicar la "autoconciencia de las mamas". Esto significa que debe reconocer la apariencia normal de sus mamas y como las siente y pudiendo incluir un autoexamen de mamas. Si detecta algn cambio, no importa cun pequeo sea, debe informarlo a su mdico. Las mujeres entre 20 y  40 aos deben hacer un examen clnico de las mamas como parte del examen regular de salud, cada 1 a 3 aos. Despus de los 40 aos deben hacerlo todos los aos. Deben hacerse una mamografa radografa de mamas ) cada ao, comenzando a los 40 aos. Las mujeres con historia familiar de cncer de mama deben hablar con el mdico para hacer un estudio gentico. Las que tienen ms riesgo deben hacerse resonancia magntica y una mamografa todos los aos.   Un test de Pap se realiza para diagnosticar cncer de cuello de tero. Las mujeres deben hacerse un test de Pap a partir de los 21 aos. Entre los 21 y los 29 aos debe repetirse cada dos aos. Luego de los 30 aos, debe realizarse un test de Pap cada tres aos siempre que los 3 estudios anteriores sean normales. Si le han realizado una histerectoma por un problema que no era cncer u otra enfermedad que podra causar cncer, ya no necesitar un test de Pap. Si tiene entre 65 y 70 aos y ha tenido un test de Pap normal en los ltimos 10 aos, ya no ser necesario realizarlo. Si ha recibido un tratamiento para el cncer cervical o para una enfermedad que podra causar cncer, necesitar realizar un test de Pap y controles durante al menos 20 aos de concluir el tratamiento. Si no se ha hecho el examen con regularidad, debern volver a evaluarse los factores de riesgo (como el tener un nuevo compaero sexual) para determinar si debe volver a realizarse los estudios. Algunas mujeres sufren problemas mdicos que aumentan la probabilidad de contraer cncer cervical. En estos casos, el mdico podr indicar que se realice el test de Pap con ms frecuencia.   La prueba del virus del papiloma humano (VPH) es un anlisis adicional que puede usarse para detectar cncer de cuello de tero. Esta prueba busca la presencia del virus que causa los cambios en el cuello. Las clulas que se recolectan durante el test de Pap pueden usarse para el VPH. La prueba para el VPH puede  usarse para evaluar a mujeres de ms de 30 aos y debe usarse en mujeres de cualquier edad cuyos resultados del test de Pap no sean claros. Despus de los 30 aos, las mujeres deben hacerse el anlisis para el VPH con la misma frecuencia que el test de Pap.   El cncer colorectal puede detectarse y con frecuencia puede prevenirse. La mayor parte de los estudios de rutina comienzan a los 50 aos y continan hasta los 75 aos. Sin embargo, el mdico podr aconsejarle que lo haga antes, si tiene factores de riesgo para el cncer de colon. Una vez por ao, el profesional le dar un kit de prueba para hallar sangre oculta en la materia fecal. La utilizacin de un tubo con una pequea cmara en su extremo para examinar directamente el colon (sigmoidoscopa o colonoscopa), puede detectar formas temprana de cncer colorectal. Hable con su mdico si tiene 50 aos, cuando comience con los estudios de rutina. El examen directo del   colon debe repetirse cada 5 a 10 aos, hasta los 75 aos, excepto que se encuentren formas tempranas de plipos precancerosos o pequeos bultos.   Se recomienda realizar un anlisis de sangre para detectar hepatitis C a todas las personas nacidas entre 1945 y 1965, y a todo aquel que tenga un riesgo conocido de haber contrado esta enfermedad.   Practique el sexo seguro. Use condones y evite las prcticas sexuales riesgosas para disminuir el contagio de enfermedades de transmisin sexual. Las mujeres sexualmente activas de 25 aos o menos deben controlarse para descartar clamidia, que es una infeccin de transmisin sexual frecuente. Las mujeres mayores que tengan mltiples compaeros tambin deben hacerse el anlisis para detectar clamidia. Se recomienda realizar anlisis para detectar otras enfermedades de transmisin sexual si es sexualmente activa y tiene riesgos.   La osteoporosis es una enfermedad en la que los huesos pierden los minerales y la fuerza por el avance de la edad. El  resultado pueden ser fracturas graves en los huesos. El riesgo de osteoporosis puede identificarse con una prueba de densidad sea. Las mujeres de ms de 65 aos y las que tengan riesgos de sufrir fracturas u osteoporosis deben pedir consejo a su mdico. Consulte a su mdico si debe tomar un suplemento de calcio o de vitamina D para reducir el riesgo de osteoporosis.   La menopausia se asocia a sntomas y riesgos fsicos. Se dispone de una terapia de reemplazo hormonal para disminuir los sntomas y los riesgos. Consulte a su mdico para saber si la terapia de reemplazo hormonal es conveniente para usted.   Use una pantalla solar con un factor SPF de 30 o mayor. Aplique pantalla de manera libre y repetida a lo largo del da. Pngase al resguardo del sol cuando la sombra sea ms pequea que usted. Protjase usando mangas y pantalones largos, un sombrero de ala ancha y gafas para el sol todo el ao, siempre que se encuentre en el exterior.   Informe a su mdico si aparecen nuevos lunares o los que tiene se modifican, especialmente en forma y color. Tambin notifique al mdico si un lunar es ms grande que el tamao de una goma de lpiz.   Mantngase al da con las vacunas.  Document Released: 06/12/2011 [employer] Patient Information 2012 [employer]. 

## 2012-02-10 NOTE — Telephone Encounter (Signed)
Message copied by Libby Maw on Tue Feb 10, 2012 11:57 AM ------      Message from: Ok Edwards      Created: Tue Feb 10, 2012 11:26 AM       Please schedule ultrasound in this patient with left sided neck mass and hoarseness.Order has been placed in Epic

## 2012-02-10 NOTE — Progress Notes (Signed)
Yvonne Romero 1971/01/19 [id number]   History:    41 y.o.  for annual gyn exam who for the past 3 weeks has been complaining of hoarseness and a questionable mass in her neck. She denies any coughing. She works at a [employer] and wears no mask. Patient with prior tubal sterilization procedure. Patient separated from her partner and is not sexually active. She reports normal menstrual cycles. Her last Pap smear was normal in 2011. In 2011 also she was diagnosed with H. pylori and treated and subsequently was followed by the gastroenterologist Dr.Hung who had done her upper endoscopy and it was reported to be normal. Review of her record indicates she was weighing 132 is down to 129.  Past medical history,surgical history, family history and social history were all reviewed and documented in the EPIC chart.  Gynecologic History Patient's last menstrual period was 01/26/2012. Contraception: tubal ligation Last Pap: 2011. Results were: normal Last mammogram: Not indicated. Results were: Not indicated  Obstetric History OB History    Grav Para Term Preterm Abortions TAB SAB Ect Mult Living   2 2 2       2      # Outc Date GA Lbr Len/2nd Wgt Sex Del Anes PTL Lv   1 TRM     M CS  No Yes   2 TRM     F CS  No Yes       ROS: A ROS was performed and pertinent positives and negatives are included in the history.  GENERAL: No fevers or chills. HEENT: No change in vision, no earache, sore throat or sinus congestion. NECK: Mass on left side of neck. CARDIOVASCULAR: No chest pain or pressure. No palpitations. PULMONARY: No shortness of breath, cough or wheeze. GASTROINTESTINAL: No abdominal pain, nausea, vomiting or diarrhea, melena or bright red blood per rectum. GENITOURINARY: No urinary frequency, urgency, hesitancy or dysuria. MUSCULOSKELETAL: No joint or muscle pain, no back pain, no recent trauma. DERMATOLOGIC: No rash, no itching, no lesions. ENDOCRINE: No polyuria, polydipsia, no heat  or cold intolerance. No recent change in weight. HEMATOLOGICAL: No anemia or easy bruising or bleeding. NEUROLOGIC: No headache, seizures, numbness, tingling or weakness. PSYCHIATRIC: No depression, no loss of interest in normal activity or change in sleep pattern.     Exam: chaperone present  BP 118/74  Ht 5' (1.524 m)  Wt 132 lb (59.875 kg)  BMI 25.78 kg/m2  LMP 01/26/2012  Body mass index is 25.78 kg/(m^2).  General appearance : Well developed well nourished female. No acute distress HEENT: Neck supple, trachea midline, no carotid bruits, 2-1/2 cm mass on left side of neck lower pole of the thyroid Lungs: Clear to auscultation, no rhonchi or wheezes, or rib retractions  Heart: Regular rate and rhythm, no murmurs or gallops Breast:Examined in sitting and supine position were symmetrical in appearance, no palpable masses or tenderness,  no skin retraction, no nipple inversion, no nipple discharge, no skin discoloration, no axillary or supraclavicular lymphadenopathy Abdomen: no palpable masses or tenderness, no rebound or guarding Extremities: no edema or skin discoloration or tenderness  Pelvic:  Bartholin, Urethra, Skene Glands: Within normal limits             Vagina: No gross lesions or discharge  Cervix: No gross lesions or discharge  Uterus  anteverted, normal size, shape and consistency, non-tender and mobile  Adnexa  Without masses or tenderness  Anus and perineum  normal   Rectovaginal  normal sphincter tone without palpated  masses or tenderness             Hemoccult not done     Assessment/Plan:  41 y.o. female for annual exam who will be sent to the hospital for a neck ultrasound. We'll also order a thyroid function panel. We'll wait for these results and manage and refer accordingly. The following labs were also ordered: Random blood sugar, urinalysis, total cholesterol, CBC. No Pap smear done today, new Pap smear screening guidelines discussed. She was encouraged to do  her monthly self breast examination.    Ok Edwards MD, 12:07 PM 02/10/2012

## 2012-02-11 LAB — URINALYSIS W MICROSCOPIC + REFLEX CULTURE
Casts: NONE SEEN
Crystals: NONE SEEN
Glucose, UA: NEGATIVE mg/dL
Hgb urine dipstick: NEGATIVE
Ketones, ur: NEGATIVE mg/dL
Leukocytes, UA: NEGATIVE
Nitrite: NEGATIVE
Specific Gravity, Urine: 1.009 (ref 1.005–1.030)
pH: 5.5 (ref 5.0–8.0)

## 2012-02-12 ENCOUNTER — Ambulatory Visit (HOSPITAL_COMMUNITY): Payer: Managed Care, Other (non HMO) | Attending: Gynecology

## 2012-02-25 ENCOUNTER — Ambulatory Visit (HOSPITAL_COMMUNITY)
Admission: RE | Admit: 2012-02-25 | Discharge: 2012-02-25 | Disposition: A | Discharge status: Home / Self Care | Payer: Managed Care, Other (non HMO) | Source: Ambulatory Visit | Attending: Gynecology | Admitting: Gynecology

## 2012-02-25 DIAGNOSIS — E079 Disorder of thyroid, unspecified: Secondary | ICD-10-CM | POA: Insufficient documentation

## 2012-02-25 DIAGNOSIS — M542 Cervicalgia: Secondary | ICD-10-CM | POA: Insufficient documentation

## 2012-05-21 ENCOUNTER — Ambulatory Visit (INDEPENDENT_AMBULATORY_CARE_PROVIDER_SITE_OTHER): Payer: Managed Care, Other (non HMO) | Admitting: Women's Health

## 2012-05-21 ENCOUNTER — Encounter: Payer: Self-pay | Admitting: Women's Health

## 2012-05-21 VITALS — BP 118/70

## 2012-05-21 DIAGNOSIS — R102 Pelvic and perineal pain: Secondary | ICD-10-CM

## 2012-05-21 DIAGNOSIS — N949 Unspecified condition associated with female genital organs and menstrual cycle: Secondary | ICD-10-CM

## 2012-05-21 DIAGNOSIS — N898 Other specified noninflammatory disorders of vagina: Secondary | ICD-10-CM

## 2012-05-21 LAB — WET PREP FOR TRICH, YEAST, CLUE
Clue Cells Wet Prep HPF POC: NONE SEEN
Trich, Wet Prep: NONE SEEN

## 2012-05-21 MED ORDER — FLUCONAZOLE 150 MG PO TABS: 150.0000 mg | ORAL_TABLET | Freq: Once | ORAL | Status: DC

## 2012-05-21 NOTE — Progress Notes (Signed)
Patient ID: Yvonne Romero, female   DOB: 01-08-1971, 41 y.o.   MRN: [id number] Hispanic, [name] interpreted. Presents with complaint of pelvic bloating, vaginal pressure, slight vaginal itching and pulling sensation at umbilicus. Also states her sister has had a problem with ovarian cysts. Not sexually active in years, regular monthly cycle. Denies urinary symptoms, constipation or fever. History of LGA/cesarean deliveries.  Exam: Abdomen soft no rebound or radiation of pain, external genitalia within normal limits, mild vaginal weakness with bearing down, speculum exam scant white discharge no erythema or odor noted, wet prep positive for yeast. Bimanual no CMT slight tenderness on lower right quadrant, no fullness noted.  Abdominal bloating Yeast vaginitis  Plan: Diflucan 150 times one dose, instructed to call if no relief of vaginal itching. Schedule ultrasound after next cycle.

## 2012-05-21 NOTE — Patient Instructions (Addendum)
Vaginitis monilisica (Monilial Vaginitis) La vaginitis es una inflamacin (irritacin, hinchazn) de la vagina y la vulva. Esta no es una enfermedad de transmisin sexual.  CAUSAS Este tipo de vaginitis lo causa un hongo (candida) que normalmente se encuentra en la vagina. El hongo candida se ha desarrollado hasta el punto de ocasionar problemas en el equilibrio qumico. SNTOMAS  Secrecin vaginal espesa y blanca.   Hinchazn, picazn, enrojecimiento e inflamacin de la vagina y en algunos casos de los labios vaginales (vulva).   Ardor o dolor al [employer].   Dolor en las relaciones sexuales.  DIAGNSTICO Los factores que favorecen la vaginitis moniliasica son:  Everlean Patterson de virginidad y postmenopusicas.   Embarazo.   Infecciones.   Sentir cansancio, estar enferma o estresada, especialmente si ya ha sufrido este problema en el pasado.   Diabetes Buen control ayudar a disminur la probabilidad.   Pldoras anticonceptivas   Ropa interior [place].   El uso de espumas de bao, aerosoles femeninos duchas vaginales o tampones con desodorante.   Algunos antibiticos (medicamentos que destruyen grmenes).   Si contrae alguna enfermedad puede sufrir recurrencias espordicas.  TRATAMIENTO El profesional que lo asiste prescribir medicamentos.  Hay diferentes tipos de cremas y supositorios vaginales que tratan especficamente la vaginitis monilisica. Para infecciones por hongos recurrentes, utilice un supositorio o crema en la vagina dos veces por semana, o segn se le indique.   Tambin podrn utilizarse cremas con corticoides o anti monilisicas para la picazn o la irritacin de la vulva. Consulte con el profesional que la asiste.   Si la crema no da resultado, podr aplicarse en la vagina una solucin con azul de metileno.   El consumo de yogur puede prevenir este tipo de vaginitis.  INSTRUCCIONES PARA EL CUIDADO DOMICILIARIO  Tome todos los medicamentos tal como se le  indic.   No mantenga relaciones sexuales hasta que el tratamiento se haya completado, o segn las indicaciones del profesional que la asiste.   Tome baos de asiento tibios.   No se aplique duchas vaginales.   No utilice tampones, especialmente los perfumados.   Use ropa interior de algodn   [employer]ant pantalones ajustados y las medias tipo panty.   Comunique a sus compaeros sexuales que sufre una infeccin por hongos. Ellos deben concurrir para un control mdico si tienen sntomas como una urticaria leve o picazn.   Sus compaeros sexuales deben tratarse tambin si la infeccin es difcil de Pharmacologist.   Practique el sexo seguro - use condones   Algunos medicamentos vaginales ocasionan fallas en los condones de ltex. Los medicamentos vaginales que pueden daar los condones son:   Chiropodist cleocina   Butoconazole (Femstat)   Terconazole (Terazol) supositorios vaginales   Miconazole (Monistat) (es un medicamento de venta libre)  SOLICITE ATENCIN MDICA SI:  Daphane Shepherd tiene una temperatura oral de ms de 102 F (38.9 C).   Si la infeccin empeora luego de 2 [address]   Si la infeccin no mejora luego de 3 [address].   Aparecen ampollas en o alrededor de la vagina.   Si aparece una hemorragia vaginal y no es el momento del perodo.   Siente dolor al [employer].   Presenta problemas intestinales.   Tiene dolor durante las [employer].  Document Released: 04/02/2005 Document Revised: 06/12/2011 [facility] Patient Information 2012 [place], [place].

## 2012-06-14 ENCOUNTER — Other Ambulatory Visit: Payer: Self-pay | Admitting: Gynecology

## 2012-06-14 ENCOUNTER — Encounter: Payer: Self-pay | Admitting: Gynecology

## 2012-06-14 ENCOUNTER — Ambulatory Visit (INDEPENDENT_AMBULATORY_CARE_PROVIDER_SITE_OTHER): Payer: Managed Care, Other (non HMO)

## 2012-06-14 ENCOUNTER — Ambulatory Visit (INDEPENDENT_AMBULATORY_CARE_PROVIDER_SITE_OTHER): Payer: Managed Care, Other (non HMO) | Admitting: Gynecology

## 2012-06-14 VITALS — BP 120/78

## 2012-06-14 DIAGNOSIS — N831 Corpus luteum cyst of ovary, unspecified side: Secondary | ICD-10-CM

## 2012-06-14 DIAGNOSIS — R102 Pelvic and perineal pain: Secondary | ICD-10-CM

## 2012-06-14 DIAGNOSIS — N83209 Unspecified ovarian cyst, unspecified side: Secondary | ICD-10-CM

## 2012-06-14 DIAGNOSIS — Z23 Encounter for immunization: Secondary | ICD-10-CM

## 2012-06-14 DIAGNOSIS — N949 Unspecified condition associated with female genital organs and menstrual cycle: Secondary | ICD-10-CM

## 2012-06-14 DIAGNOSIS — K429 Umbilical hernia without obstruction or gangrene: Secondary | ICD-10-CM

## 2012-06-14 DIAGNOSIS — N83202 Unspecified ovarian cyst, left side: Secondary | ICD-10-CM

## 2012-06-14 NOTE — Progress Notes (Signed)
The patient is a 41 year old who was seen the office November 15 by my nurse practitioner in my absence. Patient was complaining of pelvic bloating, vaginal pressure and slight vaginal itching and pulling sensation at the umbilicus. Patient stated she was not sexually active and having normal menstrual cycles. Patient was without any urinary or GI symptoms at that time. 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 It appears there was a language barrier but her sensation appears to be at times of ovulation and it is only  occasionally and alternates  from right or left side. She describes it as short lived.   Ultrasound today: Uterus measuring 9.0 x 6.9 x 4.7 cm with endometrial stripe of 10.5 mm patient on day of her cycle. Right ovary was normal. Left ovary had a thick wall cyst with the reticular echo pattern measuring 25 x 25 x 20 mm with positive color flow in the periphery. There was minimal fluid in the cul-de-sac. Nabothian cysts of the cervix were noted.  Exam today: Abdomen soft nontender no rebound or guarding. A small umbilicus old hernia was noted minimally tender. Pelvic: Bartholin urethra Skene was within normal limits Vagina: No lesions or discharge Cervix: No lesion discharge uterus: Anteverted normal size shape and consistency Rectal exam not done  Patient with 2 prior cesarean section there was no evidence of incisional hernia at the site of Pfannenstiel incision. There was no evidence of femoral or inguinal hernia. There was no evidence of uterine descensus and a very mild first degree cystocele was noted and no rectocele. Of note patient was examined in the supine and erect position.  Patient was reassured. She received her flu vaccine today. If she begins to experience pain in the umbilical region she will notify the office so that we can refer her to the general surgeon for umbilical herniorrhaphy.  She will return back in 3 months for followup ultrasound for what appears to be a functional cyst. All the above was clamped the patient Spanish.

## 2012-06-14 NOTE — Patient Instructions (Addendum)
Quiste ovrico (Ovarian Cyst) Los ovarios son pequeos rganos que se encuentran a cada lado del tero. Los ovarios son los rganos que producen las hormonas femeninas, estrgeno y progesterona. Un quiste en el ovario es una bolsa llena de lquido que puede variar en tamao. Es normal que se formen pequeos quistes en las mujeres en edad de procrear y que an tienen sus perodos menstruales. Este tipo de quiste se denomina quiste folicular que se transforma en un quiste ovulatorio (quiste del cuerpo lteo) despus de producir los vulos. Si la mujer no queda embarazada, desaparece sin ninguna intervencin. Existen otros tipos de quistes de ovario que pueden causar problemas y necesitan ser tratados. El problema ms grave es que el quiste sea canceroso. Debe advertirse que en las mujeres menopusicas que presentan un quiste de ovario, existe un mayor riesgo de que ese quiste sea canceroso. Deben evaluarse muy rpida y cuidadosamente, y controlarse adecuadamente. Esto es ms importante en las mujeres menopusicas debido al elevado porcentaje de cncer de ovario durante este perodo. CAUSAS Y TIPOS DE CNCER DE OVARIO:  QUISTE FUNCIONAL: El quiste de folculo o cuerpo lteo es un quiste funcional que aparece todos los meses durante la ovulacin, con el ciclo menstrual. Si la mujer no queda embarazada, desaparecen con el prximo ciclo menstrual. Generalmente los quistes funcionales no presentan sntomas.  ENDOMETRIOMA: este quiste aparece en la superficie del tejido del tero. Un quiste se forma en el interior o sobre el ovario. Cada mes se desarrolla un poco ms debido a la sangre del perodo menstrual. Tambin se denomina "quiste de chocolate" debido a que est lleno de sangre que se vuelve color marrn. Este tipo de quiste causa dolor en la zona inferior del abdomen durante las relaciones sexuales y durante el perodo menstrual.  CISTADENOMA: Se desarrolla a partir de las clulas externas del ovario.  Generalmente no son cancerosos. Pueden llegar a ser de gran tamao y causar dolor en la zona baja del abdomen y durante las relaciones sexuales. Este tipo de quiste puede retorcerse e interrumpir el flujo de sangre, lo que causa un dolor muy intenso. Tambin puede romperse y causar mucho dolor.  QUISTE DERMOIDE: generalmente este tipo de quiste aparece en ambos ovarios. Puede haber diferentes tipos de tejidos en el quiste. Por ejemplo tejidos de piel, dientes, pelos o cartlago. En general no dan sntomas, excepto que sean muy grandes. Los quistes dermoides rara vez son cancerosos.  OVARIO POLIQUSTICO: es una enfermedad rara relacionada con trastornos hormonales que produce muchos quistes pequeos en ambos ovarios. Estos quistes son similares a los quistes de folculo pero nunca producen vulos y se transforman en cuerpo lteo. Pueden causar aumento del peso corporal, infertilidad, acn, aumento del vello facial y corporal y falta de perodos menstruales o perodos anormales. Muchas mujeres que sufren este problema presentan diabetes tipo 2. La causa exacta de este problema es desconocida. Un ovario poliqustico rara vez es canceroso.  QUISTE OVRICO TECALUTESTICO Aparece cuando hay demasiada hormona (gonadotrofina corinica humana), la que sobreestimula al ovario para producir vulos. Se observan con frecuencia cuando el mdico estimula los ovarios para la fertilizacin in vitro (bebs de probeta).  QUISTE LUTENICO: Aparece durante el embarazo. En algunos casos raros, produce una obstruccin del canal de parto. Generalmente desaparece despus del parto. SNTOMAS  Dolor o molestias en la pelvis.  Dolor durante las relaciones sexuales.  Aumento de la inflamacin en el abdomen.  Perodos menstruales anormales.  Aumento del dolor en los perodos menstruales.  Deja de menstruar   y no est embarazada. DIAGNSTICO El diagnstico puede realizarse durante:  Los exmenes plvicos anuales o de  rutina (frecuente).  Ecografas  Radiografas de la pelvis.  Tomografa computada  Resonancia magntica..  Anlisis de sangre. TRATAMIENTO  El tratamiento slo consiste en que el mdico controle el quiste mensualmente, durante 2  3 meses. Muchos desaparecen espontnemente, especialmente los quistes funcionales.  Puede aspirarse (secarse) con una aguja larga observndolo en una ecografa, o por laparoscopa (insertando un tubo en la pelvis a travs de una pequea incisin).  El quiste puede extirparse con laparoscopa.  En algunos casos es necesario extirparlo a travs de una incisin en la zona inferior del abdomen.  El tratamiento hormonal se utiliza para disolver ciertos tipos de quiste.  Las pldoras anticonceptivas pueden utilizarse para disolver otros tipos. INSTRUCCIONES PARA EL CUIDADO DOMICILIARIO Siga las indicaciones del profesional con respecto a:  Medicamentos  Visitas de control para evaluar y tratar el quiste.  Puede ser necesario que tenga que volver o concertar una cita con otro profesional para descubrir la causa exacta del quiste, si su mdico no es gineclogo.  Realice un examen plvico y un Papanicolau todos los aos, segn las indicaciones.  Informe al mdico si tubo un quiste de ovario en el pasado. SOLICITE ATENCIN MDICA SI:  Los perodos se atrasan, son irregulares, le faltan o son dolorosos.  El dolor abdominal (en el vientre) o en la pelvis persisten.  El abdomen se agranda o se hincha.  Siente una opresin en la vejiga o tiene problemas para vaciarla completamente.  Tiene dolor durante las relaciones sexuales.  Tiene la sensacin de hinchazn, presin o molestias en el abdomen.  Pierde peso sin razn aparente.  Siente un malestar generalizado.  Est constipada.  Pierde el apetito.  Aparece acn.  Aumenta el vello facial y corporal.  Aumenta de peso sin hacer modificaciones en su actividad fsica y en su dieta  habitual.  Sospecha que est embarazada. SOLICITE ATENCIN MDICA DE INMEDIATO SI:  Siente dolor abdominal cada vez ms intenso.  Si tiene ganas de vomitar (nuseas).  Le sube repentinamente la fiebre.  Siente dolor abdominal al mover el intestino.  Sus perodos menstruales son ms abundantes que lo habitual. Document Released: 04/02/2005 Document Revised: 09/15/2011 [employer] Patient Information 2013 [employer].  

## 2012-06-28 ENCOUNTER — Ambulatory Visit (INDEPENDENT_AMBULATORY_CARE_PROVIDER_SITE_OTHER): Payer: Managed Care, Other (non HMO) | Admitting: Emergency Medicine

## 2012-06-28 VITALS — BP 130/84 | HR 72 | Temp 97.4°F | Resp 16 | Ht 60.0 in | Wt 125.0 lb

## 2012-06-28 DIAGNOSIS — L708 Other acne: Secondary | ICD-10-CM

## 2012-06-28 DIAGNOSIS — R490 Dysphonia: Secondary | ICD-10-CM

## 2012-06-28 DIAGNOSIS — T7840XA Allergy, unspecified, initial encounter: Secondary | ICD-10-CM

## 2012-06-28 DIAGNOSIS — L709 Acne, unspecified: Secondary | ICD-10-CM

## 2012-06-28 MED ORDER — BENZOYL PEROXIDE-ERYTHROMYCIN 5-3 % EX GEL: Freq: Two times a day (BID) | CUTANEOUS | Status: DC

## 2012-06-28 NOTE — Progress Notes (Signed)
  Subjective:    Patient ID: Yvonne Romero, female    DOB: 1970/09/23, 41 y.o.   MRN: [id number]  HPIpatient comes into our clinic today  with complaints of itching all over her body for 3 days bumps showed on her face last 2 days no new soaps no new perfumes. She did eat [place] food over the weekend but use this regularly and has never had trouble before. She saw Dr. Lily Peer in the summer with complaints of hoarseness. At that time he did an ultrasound of the thyroid and this was normal. She has chronic problems with acne. This occurs when she is on her menses. She feels an itching sensation in her skin and it is interested in having allergy testing    Review of Systems     Objective:   Physical Exam there is a significant acne eruption of her face. TMs are clear. There is mild edema of the uvula. The posterior pharynx is otherwise clear. There is no stridor noted. Her lungs are clear. There is no rash or hives on her extremities. There is no swelling of the hands or feet.        Assessment & Plan:  The patient does have rather severe acne. She also apparently is having some type of allergic reaction to an unknown source. It could be the MSG in [place] food or other allergen

## 2012-06-28 NOTE — Patient Instructions (Addendum)
Reaccin alrgica - Leve a moderada (Allergic Reaction, Mild to Moderate) Las alergias pueden ser ocasionadas por cualquier cosa a la que su organismo sea sensible. Pueden ser alimentos, medicamentos, polen, sustancias qumicas y casi cualquiera de las cosas que lo rodean en su vida diaria que producen alrgenos. Un alrgeno es todo lo que hace que una sustancia produzca alergia. Esto hace que el organismo libere anticuerpos. A travs de una cadena de eventos, estos finalmente hacen que usted libere histamina en el torrente sanguneo. La funcin de la histamina es protegerlo, pero tambin puede causar [place]. Es por eso que en el caso de las alergias se [employer] antihistamnicos. La herencia es uno de los factores que interviene en las Therapist, art. Esto significa que usted puede sufrir alguna de las alergias que sufrieron sus [place]. Las [employer] ocurrir a Actuary. Usted puede tener cierta idea de lo que ha causado la reaccin. Estamos rodeados por gran cantidad de alrgenos. Puede resultar difcil saber que caus su reaccin. Si este es Engineer, mining ataque, puede ser que no le vuelva a Radiation protection practitioner. Las alergias no pueden curarse pero pueden controlarse con medicamentos. SNTOMAS Una alergia puede causarle alguno o todos los siguientes sntomas.   Hinchazn y prurito en el interior de la boca y alrededor de la misma.   Lagrimeo y [employer] ojos.   Congestin nasal y goteo nasal.   Estornudos y tos.   Una erupcin roja que produce picazn o urticaria.   Vmitos o diarrea.   Dificultad para respirar.  Las Om[employer] pueden ocurrir a Actuary. Se denominan as porque generalmente se producen durante la misma estacin todos los aos. Puede ser Neomia Dear reaccin al moho, al polen del csped o al polen de los rboles. Otras causas del problema son los alrgenos que contienen los caros del polvo del hogar, el pelaje de las mascotas y las esporas del moho. Estos  son slo algunos de los ms comunes de [employer] cientos de alrgenos que nos rodean. Todos estos sntomas aparecen cuando se entra en contacto con el polen y otros alrgenos. Estas alergias estacionales generalmente no ponen en peligro la vida. Generalmente se trata de una incomodidad que puede aliviarse con medicamentos. La fiebre del heno es una combinacin de todos o algunos de los problemas alrgicos enumerados. Simplemente se tratan con medicamentos de venta libre como difenhidramina (Benadryl). Tome los medicamentos segn las indicaciones. Consulte con el profesional que lo asiste o siga las instrucciones de uso para las dosis para nios. TRATAMIENTO Y CUIDADO DOMICILIARIO Si presenta urticaria o una erupcin cutnea:  Tome los medicamentos como se le indic.   Puede utilizar un antihistamnico de venta libre (difenhidramina) para la urticaria y Higher education careers adviser, segn sea necesario. No conduzca ni beba alcohol hasta que desaparezca el efecto de los medicamentos para Music therapist. Los antihistamnicos causan somnolencia.   Aplquese compresas fras sobre la piel o tome baos de agua fra. Ayuda a calmar la picazn. Evite los baos o las duchas calientes. El calor puede hacer que la urticaria y la picazn empeoren.   Si las Medical sales representative y se hacen ms graves, y los medicamentos de venta libre no son Geologist, engineering, existen muchos medicamentos nuevos que el mdico le puede prescribir Otros tratamientos como la inmunoterapia o las inyecciones desensibilizantes pueden utilizarse si todos estos fracasan. Haga una consulta de seguimiento con el profesional que lo asiste si los problemas continan.  SOLICITE ANTENCIN MDICA SI:  Las alergias se tornan cada  vez ms graves.   Sospecha que puede sufrir una alergia a Environmental manager. Los sntomas generalmente ocurren dentro de los 30 minutos posteriores a haber ingerido el alimento.   Los sntomas persistieron durante 2 [address] o han empeorado.    Desarrolla nuevos sntomas.   Quiere volver a probar o que su hijo consuma nuevamente un alimento o bebida que usted cree que le causa una reaccin Counselling psychologist. Nunca realice una prueba en usted mismo, ni en su nio, si sospecha una alergia, si no se encuentra bajo la observacin de su mdico. Neomia Dear segunda exposicin a un alrgeno puede poner en peligro su vida.  SOLICITE ATENCIN MDICA DE INMEDIATO SI PRESENTA:  Dificultad para respirar, respiracin ruidosa o siente una sensacin de opresin en el pecho o en la garganta.   Presenta sarpullido, hinchazn o picazn en todo el cuerpo.  Una reaccin grave con cualquiera de los problemas mencionados debe considerarse como peligrosa para la vida. Si presenta dificultad para respirar de manera sbita, comunquese con el servicio de emergencias y solicite asistencia mdica. ESTO ES UNA EMERGENCIA. ASEGRESE QUE:   Comprende estas instrucciones.   Controlar su enfermedad.   Solicitar ayuda inmediatamente si no mejora o si empeora.  Document Released: 09/19/2008 Document Revised: 06/12/2011 [facility] Patient Information 2012 [place], [place].Acne Acne is a skin problem that causes pimples. Acne occurs when the pores in your skin get blocked. Your pores may become red, sore, and swollen (inflamed), or infected with a common skin bacterium (Propionibacterium acnes). Acne is a common skin problem. Up to 80% of people get acne at some time. Acne is especially common from the ages of 51 to 77. Acne usually goes away over time with proper treatment. CAUSES  Your pores each contain an oil gland. The oil glands make an oily substance called sebum. Acne happens when these glands get plugged with sebum, dead skin cells, and dirt. The P. acnes bacteria that are normally found in the oil glands then multiply, causing inflammation. Acne is commonly triggered by changes in your hormones. These hormonal changes can cause the oil glands to get bigger and to make more  sebum. Factors that can make acne worse include:  Hormone changes during adolescence.  Hormone changes during women's menstrual cycles.  Hormone changes during pregnancy.  Oil-based cosmetics and hair products.  Harshly scrubbing the skin.  Strong soaps.  Stress.  Hormone problems due to certain diseases.  Long or oily hair rubbing against the skin.  Certain medicines.  Pressure from headbands, backpacks, or shoulder pads.  Exposure to certain oils and chemicals. SYMPTOMS  Acne often occurs on the face, neck, chest, and upper back. Symptoms include:  Small, red bumps (pimples or papules).  Whiteheads (closed comedones).  Blackheads (open comedones).  Small, pus-filled pimples (pustules).  Big, red pimples or pustules that feel tender. More severe acne can cause:  An infected area that contains a collection of pus (abscess).  Hard, painful, fluid-filled sacs (cysts).  Scars. DIAGNOSIS  Your caregiver can usually tell what the problem is by doing a physical exam. TREATMENT  There are many good treatments for acne. Some are available over-the-counter and some are available with a prescription. The treatment that is best for you depends on the type of acne you have and how severe it is. It may take 2 months of treatment before your acne gets better. Common treatments include:  Creams and lotions that prevent oil glands from clogging.  Creams and lotions that treat or prevent infections and  inflammation.  Antibiotics applied to the skin or taken as a pill.  Pills that decrease sebum production.  Birth control pills.  Light or laser treatments.  Minor surgery.  Injections of medicine into the affected areas.  Chemicals that cause peeling of the skin. HOME CARE INSTRUCTIONS  Good skin care is the most important part of treatment.  Wash your skin gently at least twice a day and after exercise. Always wash your skin before bed.  Use mild soap.  After  each wash, apply a water-based skin moisturizer.  Keep your hair clean and off of your face. Shampoo your hair daily.  Only take medicines as directed by your caregiver.  Use a sunscreen or sunblock with SPF 30 or greater. This is especially important when you are using acne medicines.  Choose cosmetics that are noncomedogenic. This means they do not plug the oil glands.  Avoid leaning your chin or forehead on your hands.  Avoid wearing tight headbands or hats.  Avoid picking or squeezing your pimples. This can make your acne worse and cause scarring. SEEK MEDICAL CARE IF:   Your acne is not better after 8 weeks.  Your acne gets worse.  You have a large area of skin that is red or tender. Document Released: 06/20/2000 Document Revised: 09/15/2011 Document Reviewed: 04/11/2011 [facility] Patient Information 2013 [place], [place]. Acn (Acne)  El acn es un problema de la piel que causa granos. Aparece cuando los poros de la piel se obstruyen. Los poros Insurance account manager, hincharse y Cabin crew (inflamarse) o infectarse con una bacteria que habitualmente se encuentra en la piel (Propionibacterium acnes). El acn es un trastorno comn de la piel. El 80% de las personas sufre acn en algn momento. Es especialmente frecuente [employer] [address] y los [address]. Generalmente desaparece despus de un tiempo con el tratamiento adecuado. CAUSAS Cada uno de los poros contiene una glndula sebcea. Esta glndula sebcea produce una sustancia grasosa llamada sebo. El acn aparece cuando estas glndulas se obstruyen con sebo, clulas de la piel muertas y suciedad. Entonces las bacterias P. acnes que normalmente se encuentran en las glndulas sebceas se multiplican y causan inflamacin. Con frecuencia el acn es originado por cambios hormonales. Estos cambios [employer] las glndulas sebceas se agranden y produzcan ms sebo. Los factores que empeoran el acn son:   Cambios hormonales durante la  adolescencia.  Cambios hormonales en los ciclos menstruales femeninos.  Cambios hormonales Academic librarian.  Cosmticos y productos para el cabello con base de aceites.  Restregarse vigorosamente la piel.  Jabones fuertes.  Estrs.  Problemas hormonales debidos a ciertas enfermedades.  Cabello largo o grasoso que toca la piel.  Algunos medicamentos.  Presin por vinchas, mochilas u hombreras.  Exposicin a ciertos aceites y sustancias qumicas. SNTOMAS El acn aparece con ms frecuencia en el rostro, el cuello, el pecho y la parte superior de la espalda. Los sntomas son:   Bultos pequeos, rojos(granos o ppulas).  Barritos (comedones cerrados).  Espinillas (comedones abiertos).  Granos pequeos llenos de pus (pstulas).  Granos o pstulas grandes y rojas que duelen. El acn ms grave puede causar:   Infeccin en una zona en la que se acumula pus (absceso).  Sacos duros, dolorosos y llenos de lquido (quistes).  Cicatrices. DIAGNSTICO  El mdico puede diagnosticar el problema haciendo un examen fsico.  TRATAMIENTO Existen muchos tratamientos buenos para el acn. Algunos estn disponibles como medicamentos de venta libre y otros son recetados. El mejor tratamiento para usted depende  del tipo de acn que presente y de su gravedad. Puede llevar 2 meses de tratamiento antes de que el acn comience a mejorar. Los tratamientos ms comunes son:   Control and instrumentation engineer y lociones que evitan que las glndulas sebceas se obstruyan.  Cremas y lociones para tratar o prevenir infecciones e inflamacin.  Antibiticos-sobre la piel o por va oral.  Comprimidos que disminuyan la produccin de cebo.  Anticonceptivos.  Luces especiales o rayo lser.  Ciruga menor.  Medicamentos inyectables en las zonas de acn.  Sustancias qumicas que produzcan un peeling en la piel. INSTRUCCIONES PARA EL CUIDADO DOMICILIARIO Un buen cuidado de la piel es lo ms importante del tratamiento.    Lave la piel suavemente al [employer] veces por da y luego de Education administrator actividad fsica. Limpie su piel siempre antes de irse a dormir.  Utilice un jabn suave.  Despus de lavarse, aplique una crema humectante a base de agua.  Mantenga el cabello limpio y fuera del rostro. Lvelo todos los das con [address]  Tome slo la medicacin que le indic el profesional.  Use pantalla solar con SPF 30  ms. Esto es muy importante si toma medicamentos para Patent attorney.  Elija cosmticos no comedognicos. Esto significa que no obstruyan las glndulas sebceas.  Evite apoyar la barbilla o la frente en las manos.  Evite el uso de vinchas o sombreros apretados.  Evite rascarse o apretar los granos. Esto puede hacer que el acn empeore y cause cicatrices. SOLICITE ANTENCIN MDICA SI:  El acn no mejora en 8 semanas.  El acn [place].  Observa una zona mayor de piel est roja o duele. Document Released: 06/23/2005 Document Revised: 09/15/2011 [facility] Patient Information 2013 [place], [place].

## 2012-09-06 ENCOUNTER — Other Ambulatory Visit: Payer: Self-pay | Admitting: Gynecology

## 2012-09-07 ENCOUNTER — Other Ambulatory Visit: Payer: Self-pay | Admitting: *Deleted

## 2012-09-07 DIAGNOSIS — N949 Unspecified condition associated with female genital organs and menstrual cycle: Secondary | ICD-10-CM

## 2012-09-13 ENCOUNTER — Encounter: Payer: Self-pay | Admitting: Gynecology

## 2012-09-13 ENCOUNTER — Ambulatory Visit (INDEPENDENT_AMBULATORY_CARE_PROVIDER_SITE_OTHER): Payer: 59 | Admitting: Gynecology

## 2012-09-13 ENCOUNTER — Ambulatory Visit: Payer: Managed Care, Other (non HMO) | Admitting: Gynecology

## 2012-09-13 ENCOUNTER — Ambulatory Visit (INDEPENDENT_AMBULATORY_CARE_PROVIDER_SITE_OTHER): Payer: 59

## 2012-09-13 ENCOUNTER — Other Ambulatory Visit: Payer: Managed Care, Other (non HMO)

## 2012-09-13 VITALS — BP 138/78

## 2012-09-13 DIAGNOSIS — N949 Unspecified condition associated with female genital organs and menstrual cycle: Secondary | ICD-10-CM

## 2012-09-13 DIAGNOSIS — N8300 Follicular cyst of ovary, unspecified side: Secondary | ICD-10-CM

## 2012-09-13 DIAGNOSIS — N831 Corpus luteum cyst of ovary, unspecified side: Secondary | ICD-10-CM

## 2012-09-13 MED ORDER — BENZOYL PEROXIDE 10 % EX GEL: CUTANEOUS | Status: DC

## 2012-09-13 MED ORDER — HYDROCOD POLST-CHLORPHEN POLST 10-8 MG/5ML PO LQCR: 5.0000 mL | Freq: Two times a day (BID) | ORAL | Status: DC

## 2012-09-13 MED ORDER — AMOXICILLIN-POT CLAVULANATE 875-125 MG PO TABS: 1.0000 | ORAL_TABLET | Freq: Two times a day (BID) | ORAL | Status: DC

## 2012-09-13 NOTE — Progress Notes (Signed)
Patient presented to the office today with several complaints. First problem be addressed today was a followup ultrasound due to the fact that on office visit on December 9 patient had been complaining of pelvic discomfort and bloating and vaginal pressure. She was found to have the following on ultrasound at that time:  Uterus measuring 9.0 x 6.9 x 4.7 cm with endometrial stripe of 10.5 mm patient on day of her cycle. Right ovary was normal. Left ovary had a thick wall cyst with the reticular echo pattern measuring 25 x 25 x 20 mm with positive color flow in the periphery. There was minimal fluid in the cul-de-sac. Nabothian cysts of the cervix were noted.  Her followup ultrasound today as follows: Uterus measured 9.2 x 6.6 x 4.8 cm with endometrial stripe of 10.3 mm. (Last menstrual period started 09/07/2012). Heterogeneous echo C. Section scar noted. Trial are endometrial cavity noted. Right ovary with follicles noted measuring 16 x 10 mm. Left ovary was normal previous cystic area not seen.  Patient's second problem was that her acne has become worse. Right before Christmas she went to the urgent care was given a prescription for medication that she could not find the pharmacy.it appears her acne we'll parous is getting worse. She will be placed on BenZac AC to apply 2 times a day. And I'm going to refer her to one of my dermatology colleagues.  The third issue to discuss today with patient been complaining for the past 3 weeks of a nonproductive cough. On exam there was inspiratory rhonchi in both lung fields but no wheezing. She will be placed on Augmentin 875 mg to take 1 by mouth twice a day for 7 days for her mild bronchitis and as and expectorate she will be prescribed Tussionex  5 cc by mouth every 12 hours.  Her for problems deals with her umbilical hernia that was reducible and tender even on today's exam. She will be referred to the general surgeon for further evaluation and consider surgical  intervention since she is symptomatic.

## 2013-02-10 ENCOUNTER — Encounter: Payer: 59 | Admitting: Gynecology

## 2013-03-24 ENCOUNTER — Other Ambulatory Visit (HOSPITAL_COMMUNITY)
Admission: RE | Admit: 2013-03-24 | Discharge: 2013-03-24 | Disposition: A | Discharge status: Home / Self Care | Payer: 59 | Source: Ambulatory Visit | Attending: Gynecology | Admitting: Gynecology

## 2013-03-24 ENCOUNTER — Encounter: Payer: Self-pay | Admitting: Gynecology

## 2013-03-24 ENCOUNTER — Ambulatory Visit (INDEPENDENT_AMBULATORY_CARE_PROVIDER_SITE_OTHER): Payer: 59 | Admitting: Gynecology

## 2013-03-24 VITALS — BP 132/82 | Ht 60.0 in | Wt 132.2 lb

## 2013-03-24 DIAGNOSIS — N926 Irregular menstruation, unspecified: Secondary | ICD-10-CM

## 2013-03-24 DIAGNOSIS — Z23 Encounter for immunization: Secondary | ICD-10-CM

## 2013-03-24 DIAGNOSIS — Z01419 Encounter for gynecological examination (general) (routine) without abnormal findings: Secondary | ICD-10-CM

## 2013-03-24 DIAGNOSIS — K429 Umbilical hernia without obstruction or gangrene: Secondary | ICD-10-CM

## 2013-03-24 DIAGNOSIS — R141 Gas pain: Secondary | ICD-10-CM

## 2013-03-24 DIAGNOSIS — Z1151 Encounter for screening for human papillomavirus (HPV): Secondary | ICD-10-CM | POA: Insufficient documentation

## 2013-03-24 DIAGNOSIS — R140 Abdominal distension (gaseous): Secondary | ICD-10-CM

## 2013-03-24 DIAGNOSIS — Z8742 Personal history of other diseases of the female genital tract: Secondary | ICD-10-CM

## 2013-03-24 LAB — TSH: TSH: 1.282 u[IU]/mL (ref 0.350–4.500)

## 2013-03-24 LAB — CBC WITH DIFFERENTIAL/PLATELET
Basophils Absolute: 0 10*3/uL (ref 0.0–0.1)
Basophils Relative: 0 % (ref 0–1)
Eosinophils Absolute: 0.1 10*3/uL (ref 0.0–0.7)
Eosinophils Relative: 1 % (ref 0–5)
HCT: 31.2 % — ABNORMAL LOW (ref 36.0–46.0)
MCH: 23.2 pg — ABNORMAL LOW (ref 26.0–34.0)
MCHC: 32.4 g/dL (ref 30.0–36.0)
MCV: 71.6 fL — ABNORMAL LOW (ref 78.0–100.0)
Monocytes Absolute: 0.3 10*3/uL (ref 0.1–1.0)
Monocytes Relative: 7 % (ref 3–12)
Neutro Abs: 3.4 10*3/uL (ref 1.7–7.7)
RDW: 18.4 % — ABNORMAL HIGH (ref 11.5–15.5)

## 2013-03-24 LAB — COMPREHENSIVE METABOLIC PANEL
AST: 25 U/L (ref 0–37)
Alkaline Phosphatase: 65 U/L (ref 39–117)
BUN: 13 mg/dL (ref 6–23)
Creat: 0.63 mg/dL (ref 0.50–1.10)
Glucose, Bld: 90 mg/dL (ref 70–99)
Total Bilirubin: 0.4 mg/dL (ref 0.3–1.2)

## 2013-03-24 MED ORDER — NORETHIN ACE-ETH ESTRAD-FE 1-20 MG-MCG(24) PO TABS: 1.0000 | ORAL_TABLET | Freq: Every day | ORAL | Status: DC

## 2013-03-24 NOTE — Patient Instructions (Signed)
Uso de los anticonceptivos orales  (Oral Contraception Use) Los anticonceptivos orales son medicamentos que se utilizan para Location manager. Su funcin es [employer] ovarios liberen vulos. Las hormonas de los anticonceptivos orales hacen que el moco cervical se haga ms espeso, lo que evita que el esperma ingrese al tero. Tambin hacen que la membrana que tapiza el tero se vuelva ms fina, lo que no permite que el huevo fertilizado se adhiera a la pared del tero. Los anticonceptivos orales son muy efectivos cuando se toman exactamente como se prescriben. Sin embargo, los anticonceptivos orales no previenen contra las enfermedades de transmisin sexual (ETS). La prctica del sexo seguro, como el uso de preservativos, junto con los anticonceptivos [place], [place] a prevenir ese tipo de enfermedades. Antes de tomar la pldora, usted debe hacerse un examen fsico y un [place]apan[place]. El mdico podr indicarle anlisis de [place], si es necesario. El mdico se asegurar de que usted es una buena candidata para usar anticonceptivos orales. Converse con su mdico acerca de los posibles efectos secundarios de los anticonceptivos [place]. Cuando se inicia el uso de anticonceptivos [place], se pueden tomar durante 2 a 3 meses para que el cuerpo se adapte a los cambios en los niveles hormonales en el cuerpo.  CMO TOMAR LOS ANTICONCEPTIVOS ORALES  El mdico le indicar como comenzar a Surveyor, minerals ciclo de anticonceptivos orales. De lo contrario usted puede:   Psychiatric nurse. da del ciclo menstrual. No necesitar proteccin anticonceptiva adicional al Investment banker, operational.  Comenzar Financial risk analyst domingo luego de su perodo menstrual, o Medical laboratory scientific officer en que adquiere el [employer]. En estos casos deber [employer] proteccin anticonceptiva [employer] primeros 7 das del [place]. Luego de comenzar a tomar los anticonceptivos orales:   Si olvid de tomar 1 pldora, tmela tan pronto como lo recuerde. Tome la  siguiente pldora a la hora habitual.  Si olvida tomar 2  ms pldoras, utilice un mtodo anticonceptivo adicional hasta que comience su prximo perodo menstrual.  Si utiliza el envase de 28 pldoras y [place] tomar 1 de las ltimas 7 (pldoras sin hormonas), sto no tiene [place] manager. Simplemente deseche el resto de las pldoras que no contienen hormonas y comience un nuevo envase. No importa cuando comience a tomar los anticonceptivos, siempre empiece un nuevo envase el mismo da de la [place]. Tenga un envase extra de pldoras anticonceptivas y use un mtodo anticonceptivo adicional para el caso en que se olvide de tomar algunas pldoras o pierda la caja.  INSTRUCCIONES PARA EL CUIDADO DOMICILIARIO  No fume.  Siempre use un condn para protegerse de las enfermedades de transmisin sexual. Los anticonceptivos orales no protegen contra las enfermedades de transmisin sexual.  Research scientist (medical) en un calendario las fechas en las que tiene sus perodos [place].  Lea la informacin y consejos que vienen con las pldoras. Pngase en contacto con el mdico siempre que tenga preguntas. SOLICITE ATENCIN MDICA SI:  Presenta nuseas o vmitos.  Tiene flujo o sangrado vaginal anormal.  Aparece una erupcin cutnea.  No tiene el perodo menstrual.  Pierde el cabello.  Necesita tratamiento por cambios en su estado de nimo o por depresin.  Se siente mareada al tomar la pldora.  Comienza a aparecer acn con el uso de los anticonceptivos orales.  Ardelle Anton. SOLICITE ATENCIN MDICA DE INMEDIATO SI:  Siente dolor en el pecho.  Le falta el aire.  Le duele mucho la cabeza y no puede Human resources officer.  Siente adormecimiento o tiene  dificultad para hablar.  Tiene problemas de visin.  Presenta dolor, inflamacin o hinchazn en las piernas. Document Released: 06/12/2011 Document Revised: 09/15/2011 [facility] Patient Information 2014 [place], [place]. Vacuna difteria, ttanos, tos  ferina (DTP) - Lo que debe saber  (Tetanus, Diphtheria, Pertussis [Tdap] Vaccine, What You Need to Know) PORQU VACUNARSE?  El ttanos, la difteria y la tos Benetta Spar pueden ser enfermedades muy graves, an en adolescentes y [place]. La vacuna Tdap nos puede proteger de estas enfermedades.  El TTANOS (Trismo) provoca la contraccin dolorosa de los msculos, por lo general, en todo el cuerpo.   Puede causar el endurecimiento de los msculos de la cabeza y el cuello, de modo que impide abrir la boca, tragar y en algunos casos, Industrial/product designer. El ttanos causa la muerte de 1 de cada 5 personas que se infectan. La DIFTERIA produce la formacin de una membrana gruesa que cubre el fondo de la garganta.   Puede causar problemas respiratorios, parlisis, insuficiencia cardaca e incluso la muerte. TOS FERINA (Pertusis) causa episodios de tos graves, que pueden hacer difcil la respiracin, causar vmitos y trastornos del sueo.   Tambin puede ser la causa de prdida de [place], incontinencia y Surveyor, minerals de Forensic psychologist. Dos de cada 100 adolescentes y Orthoptist de cada 100 adultos que enferman de pertusis deben ser hospitalizados, tienen complicaciones como la neumona o [place]. Estas enfermedades son provocadas por bacterias. La difteria y el pertusis se contagian de persona a persona a travs de la tos o el estornudo. El ttanos ingresa al organismo a travs de cortes, rasguos o heridas.  Antes de las vacunas, en los [place] se vieron ms de 200.000 casos al ao de difteria y tos [place] y cientos de casos de ttanos. Desde el inicio de la vacunacin, los casos de ttanos y difteria han disminuido alrededor del 99% y los casos de tos ferina alrededor del 80%.  Tdap  La vacuna Tdap protege a adolescentes y adultos contra el ttanos, la difteria y la tos [place]. Una dosis de Tdap se administra a los 11 o 12 aos de edad. Las [employer] no recibieron la vacuna Tdap a esa edad deben recibirla tan pronto como sea  posible.  Es muy importante que los profesionales de la salud y todos aquellos que tengan contacto cercano con bebs menores de 12 meses reciban la Tdap.  Las mujeres embarazadas deben recibir una dosis de Tdap en cada Psychiatrist, para proteger al recin nacido de la tos [place]. Los nios tienen mayor riesgo de complicaciones graves y potencialmente mortales debido a la tos [place].  Una vacuna similar, llamada Td, protege contra el ttanos y la difteria, pero no contra la tos [place]. Cada 10 aos debe recibirse un refuerzo de Td. La Tdap se puede administrar como uno de estos refuerzos, si todava no ha recibido una dosis. Tambin se puede aplicar despus de un corte o quemadura grave para prevenir la infeccin por ttanos.  El mdico le dar ms informacin.  La Tdap puede administrarse de manera segura simultneamente con otras vacunas.  ALGUNAS PERSONAS NO DEBEN RECIBIR ESTA VACUNA.   Si alguna vez tuvo una reaccin alrgica potencialmente mortal despus de Neomia Dear dosis de la vacuna contra el ttanos, la diferia o la tos [place], o tuvo una alergia grave a cualquiera de los componentes de esta vacuna, no debe aplicarse la vacuna. Informe a su mdico si usted sufre algn tipo de alergia grave.  Si estuvo en coma o sufri mltiples convulsiones dentro de los [address]  posteriores despus de una dosis de DTP o DTaP no debe recibir la Tdap, salvo que se encuentre otra causa En este caso puede recibir la Td.  Consulte con su mdico si:  tiene epilepsia u otra enfermedad del sistema nervioso,  siente dolor intenso o se hincha despus de recibir cualquier vacuna contra la difteria, el ttanos o la tos [place],  alguna vez ha sufrido el sndrome de Pension scheme manager,  no se siente Research scientist (life sciences) en que se ha programado la vacuna. RIESGOS DE UNA REACCIN A LA VACUNA Con cualquier medicamento, incluyendo las vacunas, existe la posibilidad de que aparezcan efectos secundarios. Estos son leves y desaparecen por s  solos, pero tambin son posibles las reacciones graves.  Breves episodios de [employer] seguir a una vacunacin, causando lesiones por la cada. Sentarse o recostarse durante 15 minutos puede ayudar a International aid/development worker. Informe al mdico si se siente mareado o aturdido, tiene [employer] visin o zumbidos en los odos.  Problemas leves luego de la Tdap (no interferirn con las actividades)   Dolor en el sitio de la inyeccin (alrededor de 1 de cada 4 adolescentes o 2 de cada 3 adultos).  Enrojecimiento o hinchazn en el lugar de la inyeccin (1 de cada 5 personas).  Fiebre leve de al menos 100,4 F (38 C) (hasta alrededor de 1 cada 25 adolescentes y 1 de cada 100 adultos).  Dolor de cabeza (3 o 4de cada 10 personas).  Cansancio (1 de cada 3 o 4 personas).  Nuseas, vmitos, diarrea, dolor de estmago (1 de cada 4 adolescentes o 1 de cada 10 adultos).  Escalofros, dolores corporales, dolor articular, erupciones, inflamacin de las glndulas (poco frecuente). Problemas moderados: (interfieren con las actividades, pero no requieren atencin mdica)   Art therapist de la inyeccin (1 de cada 5 adolescentes o 1 de cada 100 adultos).  Enrojecimiento o inflamacin (1 de cada 16 adolescentes y 1 de cada 25 adultos).  Fiebre de ms de 102F o 38,9C (1 de cada 100 adolescentes o 1 de cada 250 adultos).  Dolor de cabeza (alrededor de 4 de cada 20 adolescentes y 3 de cada 10 adultos).  Nuseas, vmitos, diarrea, dolor de estmago (1 a 3 de cada 100 personas).  Hinchazn de todo el brazo en el que se aplic la vacuna (3 de cada100 personas). Problemas graves: luego de la Tdap (no puede Education officer, environmental las actividades habituales, requiere atencin mdica)   Inflamacin, dolor intenso, sangrado y enrojecimiento en el brazo, en el sitio de la inyeccin (poco frecuente). Una reaccin alrgica grave puede ocurrir despus de la administracin de cualquier vacuna (se estima en menos de 1 en un milln de  dosis).  QU PASA SI HAY UNA REACCIN GRAVE?  Qu signos debo buscar?  Observe todo lo que le preocupe, como signos de una reaccin alrgica grave, fiebre muy alta o cambios en el comportamiento. Los signos de Runner, broadcasting/film/video grave pueden incluir urticaria, hinchazn de la cara y la garganta, dificultad para respirar, ritmo cardaco acelerado, mareos y debilidad. Estos sntomas pueden comenzar entre unos pocos minutos y algunas horas despus de la vacunacin.  Qu debo hacer?  Si usted piensa que se trata de una reaccin alrgica grave o de otra emergencia que no puede esperar, llame al 911 o lleve a la persona al hospital ms cercano. De lo contrario, llame a su mdico.  Despus, la reaccin debe informarse a la "Vaccine Adverse Event Reporting System" (Sistema de informacin sobre efectos adversos de  las vacunas -VAERS). El mdico o usted mismo pueden Sales executive informe en el sitio web del VAERS www.vaers.RepJet.co.nz llame al [phone]. El VAERS es slo para Biomedical engineer. No brindan consejo mdico.  PROGRAMA NACIONAL DE COMPENSACIN DE DAOS POR VACUNAS  El National Vaccine Injury Compensation Program (VICP) es un programa federal que fue creado para compensar a las personas que puedan haber sufrido daos al recibir ciertas vacunas.  Aquellas personas que consideren que han sufrido un dao como consecuencia de una vacuna y quieren saber ms acerca del programa y como presentar Roslynn Amble, pueden llamar [phone] o visite el sitio web del VICP en SpiritualWord.[email or web address].  CMO PUEDO OBTENER MS INFORMACIN?   Consulte a su mdico.  Comunquese con el servicio de salud de su localidad o [address].  Comunquese con los Centros para el control y la prevencin de Child psychotherapist for Disease Control and Prevention , CDC).  llamando al [phone] o visitando el sitio web del CDC en PicCapture.uy. CDC Tdap Vaccine VIS (11/13/11)  Document  Released: 06/09/2012 [facility] Patient Information 2014 [place], [place].

## 2013-03-24 NOTE — Progress Notes (Addendum)
Yvonne Romero [birth year] [id number]   History:    42 y.o.  for annual gyn exam with complaint of feeling bloated times before her menses. Her cycles are heavy for the first 3-4 days and he occurred 4-6 weeks. Patient does have history of a small umbilical hernia. She also had an ovarian cyst on her left ovary which was followed with ultrasound in March of this year and result. She has maintained her current weight 132 pounds.  Patient also has had history of recurrent tonsillitis and left-sided neck pain and discomfort. She did have a normal thyroid ultrasound last year. Patient has not received her Tdap vaccine. Patient with no prior history of abnormal Pap smears.  Past medical history,surgical history, family history and social history were all reviewed and documented in the EPIC chart.  Gynecologic History Patient's last menstrual period was 03/13/2013. Contraception: tubal ligation Last Pap: 2011. Results were: normal Last mammogram: none indicated. Results were: none indicated  Obstetric History OB History  Gravida Para Term Preterm AB SAB TAB Ectopic Multiple Living  2 2 2       2     # Outcome Date GA Lbr Len/2nd Weight Sex Delivery Anes PTL Lv  2 TRM     F CS  N Y  1 TRM     M CS  N Y       ROS: A ROS was performed and pertinent positives and negatives are included in the history.  GENERAL: No fevers or chills. HEENT: No change in vision, no earache, sore throat or sinus congestion. NECK: No pain or stiffness. CARDIOVASCULAR: No chest pain or pressure. No palpitations. PULMONARY: No shortness of breath, cough or wheeze. GASTROINTESTINAL: No abdominal pain, nausea, vomiting or diarrhea, melena or bright red blood per rectum.bloating before menses GENITOURINARY: No urinary frequency, urgency, hesitancy or dysuria. MUSCULOSKELETAL: No joint or muscle pain, no back pain, no recent trauma. DERMATOLOGIC: No rash, no itching, no lesions. ENDOCRINE: No polyuria, polydipsia, no heat or cold  intolerance. No recent change in weight. HEMATOLOGICAL: No anemia or easy bruising or bleeding. NEUROLOGIC: No headache, seizures, numbness, tingling or weakness. PSYCHIATRIC: No depression, no loss of interest in normal activity or change in sleep pattern.     Exam: chaperone present  BP 132/82  Ht 5' (1.524 m)  Wt 132 lb 3.2 oz (59.966 kg)  BMI 25.82 kg/m2  LMP 03/13/2013  Body mass index is 25.82 kg/(m^2).  General appearance : Well developed well nourished female. No acute distress HEENT: Neck supple, trachea midline, no carotid bruits, no thyroidmegaly,left peritonsillar leukoplakic area Lungs: Clear to auscultation, no rhonchi or wheezes, or rib retractions  Heart: Regular rate and rhythm, no murmurs or gallops Breast:Examined in sitting and supine position were symmetrical in appearance, no palpable masses or tenderness,  no skin retraction, no nipple inversion, no nipple discharge, no skin discoloration, no axillary or supraclavicular lymphadenopathy Abdomen: no palpable masses or tenderness, no rebound or guarding,small reducible umbilical hernia Extremities: no edema or skin discoloration or tenderness  Pelvic:  Bartholin, Urethra, Skene Glands: Within normal limits             Vagina: No gross lesions or discharge  Cervix: No gross lesions or discharge  Uterus  anteverted, normal size, shape and consistency, non-tender and mobile  Adnexa  Without masses or tenderness  Anus and perineum  normal   Rectovaginal  normal sphincter tone without palpated masses or tenderness  Hemoccult none indicated     Assessment/Plan:  42 y.o. female for annual exam who will be referred to the ENT because of her chronic tonsillitis and recent findings of peritonsillar leukoplakic plaque. She had no submandibular lymphadenopathy. In an effort to regulate patient's menstrual cycle and to improve her dysmenorrhea and menorrhagia she will be placed on a 20 mcg oral contraceptive pill  called LoMedia which she will begin with her next cycle. The risks benefits and pros and cons were discussed and no contraindications. The patient received a Tdap vaccine today as well as her Pap smear. The following labs were were: CBC, TSH, screen cholesterol, comprehensive metabolic panel and urinalysis. Patient was reminded to do her monthly breast exams. We discussed importance of calcium vitamin D for osteoporosis prevention. Next year she will be her baseline mammogram. We will continue to monitor her small reducible umbilical hernia.    Ok Edwards MD, 11:06 AM 03/24/2013

## 2013-03-25 LAB — URINALYSIS W MICROSCOPIC + REFLEX CULTURE
Bacteria, UA: NONE SEEN
Bilirubin Urine: NEGATIVE
Casts: NONE SEEN
Crystals: NONE SEEN
Ketones, ur: NEGATIVE mg/dL
Nitrite: NEGATIVE
Specific Gravity, Urine: 1.019 (ref 1.005–1.030)
Squamous Epithelial / LPF: NONE SEEN
pH: 7 (ref 5.0–8.0)

## 2013-03-30 ENCOUNTER — Other Ambulatory Visit: Payer: Self-pay | Admitting: *Deleted

## 2013-03-30 DIAGNOSIS — D649 Anemia, unspecified: Secondary | ICD-10-CM

## 2013-08-30 ENCOUNTER — Ambulatory Visit: Payer: 59 | Admitting: Gynecology

## 2013-09-08 ENCOUNTER — Ambulatory Visit (INDEPENDENT_AMBULATORY_CARE_PROVIDER_SITE_OTHER): Payer: 59 | Admitting: Gynecology

## 2013-09-08 ENCOUNTER — Encounter: Payer: Self-pay | Admitting: Gynecology

## 2013-09-08 VITALS — BP 120/78

## 2013-09-08 DIAGNOSIS — N9489 Other specified conditions associated with female genital organs and menstrual cycle: Secondary | ICD-10-CM

## 2013-09-08 DIAGNOSIS — M549 Dorsalgia, unspecified: Secondary | ICD-10-CM

## 2013-09-08 DIAGNOSIS — Z113 Encounter for screening for infections with a predominantly sexual mode of transmission: Secondary | ICD-10-CM

## 2013-09-08 DIAGNOSIS — N949 Unspecified condition associated with female genital organs and menstrual cycle: Secondary | ICD-10-CM

## 2013-09-08 LAB — URINALYSIS W MICROSCOPIC + REFLEX CULTURE
Bilirubin Urine: NEGATIVE
CASTS: NONE SEEN
Crystals: NONE SEEN
GLUCOSE, UA: NEGATIVE mg/dL
Ketones, ur: NEGATIVE mg/dL
LEUKOCYTES UA: NEGATIVE
Nitrite: NEGATIVE
PH: 5.5 (ref 5.0–8.0)
PROTEIN: NEGATIVE mg/dL
Specific Gravity, Urine: 1.025 (ref 1.005–1.030)
Urobilinogen, UA: 0.2 mg/dL (ref 0.0–1.0)
WBC UA: NONE SEEN WBC/hpf (ref <3)

## 2013-09-08 LAB — WET PREP FOR TRICH, YEAST, CLUE
Clue Cells Wet Prep HPF POC: NONE SEEN
TRICH WET PREP: NONE SEEN

## 2013-09-08 LAB — RPR

## 2013-09-08 MED ORDER — METRONIDAZOLE 0.75 % VA GEL: 1.0000 | Freq: Two times a day (BID) | VAGINAL | Status: DC

## 2013-09-08 MED ORDER — FLUCONAZOLE 150 MG PO TABS: 150.0000 mg | ORAL_TABLET | Freq: Once | ORAL | Status: DC

## 2013-09-08 MED ORDER — IBUPROFEN 800 MG PO TABS: 800.0000 mg | ORAL_TABLET | Freq: Three times a day (TID) | ORAL | Status: DC | PRN

## 2013-09-08 NOTE — Progress Notes (Signed)
   Patient presented to the office today with a complaint of vaginal burning like sensation but no true discharge. Patient with prior tubal ligation. She was started last year on low dose oral estrogen for dysmenorrhea and menorrhagia and she reported breaking out and are clean however scan so she stopped that. She states her cycles were regular and has no complaints. She stated that 3 days after she had her last menstrual cycle on February 14 when she had intercourse with her partner that he had described that when he urinated he noted blood. Patient also denies any dysuria or frequency. She denies any fever, chills, nausea or vomiting. Patient has had nonspecific low back discomfort from straining and lifting at work. But no radiculopathy. Patient interested in STD screen.  Exam: Back: No CVA tenderness nontender points the lumbosacral spine region Pelvic: Bartholin urethra Skene was within normal limits Vagina: Hyperemic vagina but no true discharge noted Cervix: Same Uterus: Anteverted normal size shape and consistency Adnexa: No palpable mass or tenderness Rectal exam not done  Urinalysis: Negative Wet prep moderate WBC few bacteria GC and Chlamydia culture obtained pending at time of this dictation  Assessment/plan: Nonspecific vaginal irritation along with yeast and bacterial were noted. She will be prescribed Diflucan 150 mg 1 by mouth daily. She will be prescribed MetroGel to apply twice a day for 5-7 days. We'll await the GC and chlamydia culture as well as the HIV, RPR, hepatitis B and C. She was reminded to have her partner seek medical attention for his gross hematuria.

## 2013-09-08 NOTE — Patient Instructions (Signed)
Vaginosis bacteriana  (Bacterial Vaginosis)  La vaginosis bacteriana es una infección vaginal que perturba el equilibrio normal de las bacterias que se encuentran en la vagina. Es el resultado de un crecimiento excesivo de ciertas bacterias. Esta es la infección vaginal más frecuente en mujeres en edad reproductiva. El tratamiento es importante para prevenir complicaciones, especialmente en mujeres embarazadas, dado que puede causar un parto prematuro.  CAUSAS   La vaginosis bacteriana se origina por un aumento de bacterias nocivas que, generalmente, están presentes en cantidades más pequeñas en la vagina. Varios tipos diferentes de bacterias pueden causar esta afección. Sin embargo, la causa de su desarrollo no se comprende totalmente.  FACTORES DE RIESGO  Ciertas actividades o comportamientos pueden exponerlo a un mayor riesgo de desarrollar vaginosis bacteriana, entre los que se incluyen:  · Tener una nueva pareja sexual o múltiples parejas sexuales.  · Las duchas vaginales  · El uso del DIU (dispositivo intrauterino) como método anticonceptivo.  El contagio no se produce en baños, por ropas de cama, en piscinas o por contacto con objetos.  SIGNOS Y SÍNTOMAS   Algunas mujeres que padecen vaginosis bacteriana no presentan signos ni síntomas. Los síntomas más comunes son:  · Secreción vaginal de color grisáceo.  · Secreción vaginal con olor similar al pescado, especialmente después de mantener relaciones sexuales.  · Picazón o sensación de ardor en la vagina o la vulva.  · Ardor o dolor al orinar.  DIAGNÓSTICO   Su médico analizará su historia clínica y le examinará la vagina para detectar signos de vaginosis bacteriana. Puede tomarle una muestra de flujo vaginal. Su médico examinará esta muestra con un microscopio para controlar las bacterias y células anormales. También puede realizarse un análisis del pH vaginal.   TRATAMIENTO   La vaginosis bacteriana puede tratarse con antibióticos, en forma de comprimidos o  de crema vaginal. Puede indicarse una segunda tanda de antibióticos si la afección se repite después del tratamiento.   INSTRUCCIONES PARA EL CUIDADO EN EL HOGAR   · Tome solo medicamentos de venta libre o recetados, según las indicaciones del médico.  · Si le han recetado antibióticos, tómelos como se le indicó. Asegúrese de que finaliza la prescripción completa aunque se sienta mejor.  · No mantenga relaciones sexuales hasta completar el tratamiento.  · Comunique a sus compañeros sexuales que sufre una infección vaginal. Deben consultar a su médico y recibir tratamiento si tienen problemas, como picazón o una erupción cutánea leve.  · Practique el sexo seguro usando preservativos y tenga un único compañero sexual.  SOLICITE ATENCIÓN MÉDICA SI:   · Sus síntomas no mejoran después de 3 días de tratamiento.  · Aumenta la secreción o el dolor.  · Tiene fiebre.  ASEGÚRESE DE QUE:   · Comprende estas instrucciones.  · Controlará su afección.  · Recibirá ayuda de inmediato si no mejora o si empeora.  PARA OBTENER MÁS INFORMACIÓN   Centros para el control y la prevención de enfermedades (Centers for Disease Control and Prevention, CDC): www.cdc.gov/std  Asociación Estadounidense de la Salud Sexual (American Sexual Health Association, SHA): www.ashastd.org   Document Released: 09/30/2007 Document Revised: 04/13/2013  ExitCare® Patient Information ©2014 [employer].

## 2013-09-09 LAB — GC/CHLAMYDIA PROBE AMP
CT PROBE, AMP APTIMA: NEGATIVE
GC Probe RNA: NEGATIVE

## 2013-09-09 LAB — HEPATITIS C ANTIBODY: HCV Ab: NEGATIVE

## 2013-09-09 LAB — HIV ANTIBODY (ROUTINE TESTING W REFLEX): HIV: NONREACTIVE

## 2013-09-09 LAB — HEPATITIS B SURFACE ANTIGEN: HEP B S AG: NEGATIVE

## 2013-09-28 ENCOUNTER — Ambulatory Visit (INDEPENDENT_AMBULATORY_CARE_PROVIDER_SITE_OTHER): Payer: 59 | Admitting: Gynecology

## 2013-09-28 ENCOUNTER — Encounter: Payer: Self-pay | Admitting: Gynecology

## 2013-09-28 ENCOUNTER — Ambulatory Visit (INDEPENDENT_AMBULATORY_CARE_PROVIDER_SITE_OTHER): Payer: 59

## 2013-09-28 ENCOUNTER — Other Ambulatory Visit: Payer: Self-pay | Admitting: Gynecology

## 2013-09-28 VITALS — BP 130/82

## 2013-09-28 DIAGNOSIS — R142 Eructation: Secondary | ICD-10-CM

## 2013-09-28 DIAGNOSIS — R141 Gas pain: Secondary | ICD-10-CM

## 2013-09-28 DIAGNOSIS — Z8742 Personal history of other diseases of the female genital tract: Secondary | ICD-10-CM

## 2013-09-28 DIAGNOSIS — R143 Flatulence: Secondary | ICD-10-CM

## 2013-09-28 DIAGNOSIS — N83209 Unspecified ovarian cyst, unspecified side: Secondary | ICD-10-CM

## 2013-09-28 DIAGNOSIS — N912 Amenorrhea, unspecified: Secondary | ICD-10-CM

## 2013-09-28 DIAGNOSIS — N8300 Follicular cyst of ovary, unspecified side: Secondary | ICD-10-CM

## 2013-09-28 DIAGNOSIS — R140 Abdominal distension (gaseous): Secondary | ICD-10-CM

## 2013-09-28 DIAGNOSIS — N911 Secondary amenorrhea: Secondary | ICD-10-CM

## 2013-09-28 DIAGNOSIS — R51 Headache: Secondary | ICD-10-CM

## 2013-09-28 LAB — TSH: TSH: 2.17 u[IU]/mL (ref 0.350–4.500)

## 2013-09-28 LAB — PREGNANCY, URINE: Preg Test, Ur: NEGATIVE

## 2013-09-28 MED ORDER — MEDROXYPROGESTERONE ACETATE 10 MG PO TABS: 10.0000 mg | ORAL_TABLET | Freq: Every day | ORAL | Status: DC

## 2013-09-28 NOTE — Progress Notes (Signed)
   Patient is a 43 year old who presented to the office today stating that she not had a menstrual period since February 7. Patient with prior tubal sterilization procedure. She denied any nipple discharge or double vision but has had some headaches and has complained of abdominal bloating. A urine pregnancy test was negative in the office today. Patient several years ago had history of left ovarian cyst.  Exam: Back: No CVA tenderness Abdomen: Soft nontender no rebound or guarding Pelvic: Bartholin urethra Skene was within normal limits Vagina: No lesions or discharge Cervix: No lesions or discharge Right adnexal fullness noted Left adnexa no palpable mass or tenderness Rectal exam: Not done  Ultrasound today: Uterus measuring 9.7 x 6.6 x 4.2 cm endometrial stripe of 8mm. Right ovary appeared normal but a dominant follicle cyst measuring 34 x 36 x 24 mm average size 31 mm and it was avascular. Left ovary was normal. Minimal fluid was seen in the cul-de-sac adjacent to the left ovary.  Assessment/plan: Secondary amenorrhea. We will check a TSH and prolactin level today. She will be prescribed Provera 10 mg to take 1 by mouth daily for 10 days to help start her menses. If she does not have a menstrual cycle 1 week after the last Provera tablets she will return back to the office. She was given additional refills in the event in the near future she does not have a spontaneous menses every 30 days she did take the Provera 10 mg daily for 10 days. Will await the results of the TSH and prolactin. The patient will be asked to return to the office in 3 months for a followup ultrasound on the simple cyst.

## 2013-09-28 NOTE — Patient Instructions (Addendum)
Quiste ovrico (Ovarian Cyst) Un quiste ovrico es una bolsa llena de lquido que se forma en el ovario. Los ovarios son los rganos pequeos que producen vulos en las mujeres. Se pueden formar varios tipos de [employer]. Benito Mccreedy no son cancerosos. Muchos de ellos no causan problemas y con frecuencia desaparecen solos. Algunos pueden provocar sntomas y requerir Clinical research associate. Los tipos ms comunes de quistes ovricos son los siguientes:  Quistes funcionales: estos quistes pueden aparecer todos los meses durante el ciclo menstrual. Esto es normal. Estos quistes suelen desaparecer con el prximo ciclo menstrual si la mujer no queda embarazada. En general, los quistes funcionales no tienen sntomas.  Endometriomas: estos quistes se forman a partir del tejido que recubre el tero. Tambin se denominan "quistes de chocolate" porque se llenan de sangre que se vuelve marrn. Este tipo de quiste puede Engineer, production en la zona inferior del abdomen durante la relacin sexual y con el perodo menstrual.  Cistoadenomas: este tipo se desarrolla a partir de las clulas que se [place] en el exterior del ovario. Estos quistes pueden ser muy grandes y causar dolor en la zona inferior del abdomen y durante la relacin sexual. Cindra Presume tipo de quiste puede girar sobre s mismo, cortar el suministro de Biochemist, clinical y causar un dolor intenso. Tambin se puede romper con facilidad y Stage manager.  Quistes dermoides: este tipo de quiste a veces se encuentra en ambos ovarios. Estos quistes pueden [employer] tipos de tejidos del organismo, como piel, dientes, pelo o Database administrator. Generalmente no tienen sntomas, a menos que sean muy grandes.  Quistes tecalutenicos: aparecen cuando se produce demasiada cantidad de cierta hormona (gonadotropina corinica humana) que estimula en exceso al ovario para que produzca vulos. Esto es ms frecuente despus de procedimientos que ayudan a la concepcin de un beb  (fertilizacin in vitro). CAUSAS   Los medicamentos para la fertilidad pueden provocar una afeccin mediante la cual se forman mltiples quistes de gran tamao en los ovarios. Esta se denomina sndrome de hiperestimulacin ovrica.  El sndrome del ovario poliqustico es una afeccin que puede causar desequilibrios hormonales, los cuales pueden dar como resultado quistes ovricos no funcionales. SIGNOS Y SNTOMAS  Muchos quistes ovricos no causan sntomas. Si se presentan sntomas, stos pueden ser:  Dolor o molestias en la pelvis.  Dolor en la parte baja del abdomen.  [place].  Aumento del permetro abdominal (hinchazn).  Perodos menstruales anormales.  Aumento del [employer] perodos [place].  Cese de los perodos menstruales sin estar embarazada. DIAGNSTICO  Estos quistes se descubren comnmente durante un examen de rutina o una exploracin ginecolgica anual. Es posible que se ordenen otros estudios para obtener ms informacin sobre el [place]. Estos estudios pueden ser:  Engineer, materials.  Radiografas de la pelvis.  Tomografa computada.  Resonancia magntica.  Anlisis de [place]. TRATAMIENTO  Muchos de los quistes ovricos desaparecen por s solos, sin tratamiento. Es probable que el mdico quiera controlar el quiste regularmente durante 2 o 65mses para ver si se produce algn cambio. En el caso de las mujeres en la menopausia, es particularmente importante controlar de cerca al quiste ya que el ndice de cncer de ovario en las mujeres menopusicas es ms alto. Cuando se requiere tClinical research associate este puede incluir cualquiera de los siguientes:  Un procedimiento para drenar el quiste (aspiracin). Esto se puede realizar mFamily Dollar Storesuso de [place] y [place] se puede hacer a travs de un procedimiento laparoscpico, En  este procedimiento, se inserta un tubo delgado que emite luz y que tiene una pequea cmara en un  extremo (laparoscopio) a travs de una pequea incisin.  Ciruga para extirpar el quiste completo. Esto se puede realizar mediante una ciruga laparoscpica o Ardelia Mems ciruga abierta, la cual implica realizar una incisin ms grande en la parte inferior del abdomen.  Tratamiento hormonal o pldoras anticonceptivas. Estos mtodos a veces se usan para ayudar a Writer. [place] solo medicamentos de venta libre o recetados, segn las indicaciones del mdico.  Consulting civil engineer a las consultas de control con su mdico segn las indicaciones.  Hgase exmenes plvicos regulares y pruebas de [place]apan[place]ola[place]. SOLICITE ATENCIN MDICA SI:   Los perodos se atrasan, son irregulares, dolorosos o cesan.  El dolor plvico o abdominal no desaparece.  El abdomen se agranda o se hincha.  Siente presin en la vejiga o no puede vaciarla completamente.  Siente dolor durante las [employer].  Tiene una sensacin de hinchazn, presin o Manufacturing systems engineer.  Pierde peso sin razn aparente.  Siente un Pharmacist, hospital.  Est estreida.  Pierde el apetito.  Le aparece acn.  Nota un aumento del vello corporal y facial.  Elenore Rota de peso sin hacer modificaciones en su actividad fsica y en su dieta habitual.  Sospecha que est embarazada. SOLICITE ATENCIN MDICA DE INMEDIATO SI:   Siente cada vez ms dolor abdominal.  Tiene malestar estomacal (nuseas) y vomita.  Tiene fiebre que se presenta de [place] repentina.  Siente dolor abdominal al defecar.  Sus perodos menstruales son ms abundantes que lo habitual. Document Released: 04/02/2005 Document Revised: 04/13/2013 ExitCare Patient Information 2014 [place]. Ecografa transvaginal (Transvaginal Ultrasound) La ecografa transvaginal es una ecografa plvica en la que se utiliza una probeta metlica que se coloca en la vagina, para observar los rganos femeninos. El ecgrafo  enva ondas sonoras desde un transductor (sonda). Estas ondas sonoras chocan contra las estructuras del cuerpo (como un eco) y crean Proofreader. La imagen se observa en un monitor. Se denomina transvaginal debido a que la sonda se inserta dentro de la vagina. Puede haber una pequea molestia por la introduccin de la sonda. Esta prueba tambin puede realizarse [employer]. La ecografa endovaginal es otro nombre para la ecografa transvaginal. En una ecografa transabdominal, la sonda se coloca en la parte externa del abdomen. Este mtodo no ofrece imgenes tan buenas como la tcnica transvaginal. La ecogafa transvaginal se utiliza para observar alteraciones en el tracto genital femenino. Entre ellos se incluyen:  Problemas de infertilidad.  Malformaciones congnitas (defecto de nacimiento) del tero y los ovarios.  Tumores en el tero.  Hemorragias anormales.  Tumores y quistes de ovario.  Abscesos (tejidos inflamados y pus) en la pelvis.  Dolor abdominal o plvico sin causa aparente.  Infecciones plvicas. DURANTE EL EMBARAZO, SE UTILIZA PAR OBSERVAR:  Embarazos normales.  Un embarazo ectpico (embarazo fuera del tero).  Latidos cardacos fetales.  Anormalidades de la pelvis que no se observan bien con la ecografa transabdominal.  Sospecha de gemelos o embarazo mltiple.  Aborto inminente.  Problemas en el cuello del tero (cuello incompetente, no permanece cerrado para contener al beb).  Cuando se realiza una amniocentesis (se retira lquido de la bolsa [place], para ser [place]).  Al buscar anormalidades en el beb.  Para controlar el crecimiento, el desarrollo y la edad del feto.  Para medir la cantidad de lquido en el saco [place]  se realiza una versin externa del beb (se lo mueve a Programmer, applications).  Evaluar al beb en embarazos de alto riesgo (perfil biofsico).  Si se sospecha el deceso del beb (muerte). En algunos casos, se  utiliza un mtodo especial denominado ecografa con infusin salina, para una observacin ms precisa del tero. Se inyecta solucin salina (agua con sal) dentro del tero en pacientes no embarazadas para observar mejor su interior. Este mtodo no se Designer, fashion/clothing. La probeta tambin puede usarse para obtener biopsias de Educational psychologist, para drenar lquido de quistes de ovario y para Designer, jewellery un DIU (dispositivo intrauterino para el control de la natalidad) que no pueda [place]. PREPARACIN PARA LA PRUEBA La ecografa transvaginal se realiza con la vejiga vaca. La ecografa transabdominal se realiza con la vejiga llena. Podrn solicitarle que beba varios vasos de agua antes del examen. En algunos casos se realiza una ecografa transabdominal antes de la ecografa transvaginal para obervar los rganos del abdomen. PROCEDIMIENTO  Deber acostarse en una cama, con las rodillas dobladas y los pies en los estribos. La probeta se cubre con un condn. Dentro de la vagina y en la probeta se aplica un lubricante estril. El lubricante ayuda a transmitir las ondas sonoras y Fish farm manager la irritacin de la vagina. El mdico mover la sonda en el interior de la cavidad vaginal para escanear las estructuras plvicas. Un examen normal mostrar una pelvis normal y contenidos normales en su interior. Una prueba anormal mostrar anormalidades en la pelvis, la placenta o el beb. LAS CAUSAS DE UN RESULTADO ANORMAL PUEDEN SER:  Crecimientos o tumores en:  El tero.  Los ovarios.  La vagina.  Otras estructuras plvicas.  Crecimientos no cancerosos en el tero y los ovarios.  El ovario se retuerce y se corta el suministro de Carma Lair (torsin [place]).  Las reas de infeccin incluyen:  Enfermedad inflamatoria plvica.  Un absceso en la pelvis.  Ubicacin de un DIU. LOS PROBLEMAS QUE PUEDEN HALLARSE EN UNA MUJER EMBARAZADA SON:  Embarazo ectpico (embarazo fuera del tero).  Embarazos  mltiples.  Dilatacin (apertura) precoz anormal del cuello del tero. Esto puede indicar un cuello incompetente y Biomedical scientist.  Aborto inminente.  Muerte fetal.  Los problemas con la placenta incluyen:  La placenta se ha desarrollado sobre la abertura del cuello del tero (placenta previa).  La placenta se ha separado anticipadamente en el tero (abrupcin placentaria).  La placenta se desarrolla en el msculo del tero (placenta acreta).  Tumores del Media planner, incluyendo la enfermedad trofoblstica gestacional. Se trata de un embarazo anormal en el que no hay feto. El tero se llena de quistes similares a uvas que en algunos casos son cancerosos.  Posicin incorrecta del feto (de nalgas, de vrtice).  Retraso del desarrollo fetal intrauterino (escaso desarrollo en el tero).  Anormalidades o infeccin fetal. RIESGOS Y COMPLICACIONES No hay riesgos conocidos para la ecografa. No se toman radiografas cuando se realiza una ecografa. Document Released: 10/09/2008 Document Revised: 09/15/2011 [facility] Patient Information 2014 [place], [place].

## 2013-09-29 LAB — PROLACTIN: PROLACTIN: 17.3 ng/mL

## 2013-12-29 ENCOUNTER — Ambulatory Visit (INDEPENDENT_AMBULATORY_CARE_PROVIDER_SITE_OTHER): Payer: 59 | Admitting: Gynecology

## 2013-12-29 ENCOUNTER — Encounter: Payer: Self-pay | Admitting: Gynecology

## 2013-12-29 ENCOUNTER — Other Ambulatory Visit: Payer: Self-pay | Admitting: Gynecology

## 2013-12-29 ENCOUNTER — Ambulatory Visit (INDEPENDENT_AMBULATORY_CARE_PROVIDER_SITE_OTHER): Payer: 59

## 2013-12-29 VITALS — BP 128/80

## 2013-12-29 DIAGNOSIS — Z8742 Personal history of other diseases of the female genital tract: Secondary | ICD-10-CM

## 2013-12-29 DIAGNOSIS — N949 Unspecified condition associated with female genital organs and menstrual cycle: Secondary | ICD-10-CM

## 2013-12-29 DIAGNOSIS — R102 Pelvic and perineal pain: Secondary | ICD-10-CM

## 2013-12-29 DIAGNOSIS — N911 Secondary amenorrhea: Secondary | ICD-10-CM

## 2013-12-29 DIAGNOSIS — N912 Amenorrhea, unspecified: Secondary | ICD-10-CM

## 2013-12-29 DIAGNOSIS — Z3189 Encounter for other procreative management: Secondary | ICD-10-CM

## 2013-12-29 NOTE — Progress Notes (Signed)
   Patient presented to the office today to discuss her ultrasound. Patient had previously been seen in the office on March 25 complaining of secondary amenorrhea. Patient with prior tubal sterilization procedure. Had an ultrasound which demonstrated a right follicular cysts measuring 34 x 36 x 24 mm and was asked to return 3 months later for followup ultrasound. She did have a normal TSH and prolactin. Negative urine pregnancy test. She was started on Provera 10 mg for 10 days of each month her withdrawal and she has. She is asymptomatic today.  Ultrasound today: Uterus measured 8.3 x 6.5 x 4.0 cm with endometrial stripe of 3.9 mm. Both right and left were were normal no evidence of previous ovarian cyst seen.  Assessment/plan: Complete resolution of right ovarian cyst. Patient asymptomatic. Patient has prescription for Provera to take 10 mg 1 by mouth daily for 10 days of each month if she does not have a spontaneous menses. Patient otherwise scheduled return back to the office at the end of the year for her annual exam. She was wondering if she could conceive again in the near future. She had bilateral tubal sterilization at time of her last cesarean section. I have given her the name and number of the reproductive endocrinologist so she may visit and for consideration of in vitro fertilization. We we did discuss the implications of advanced maternal age and she were to conceive via. IVF.

## 2014-05-08 ENCOUNTER — Encounter: Payer: Self-pay | Admitting: Gynecology

## 2014-10-05 ENCOUNTER — Other Ambulatory Visit (HOSPITAL_COMMUNITY)
Admission: RE | Admit: 2014-10-05 | Discharge: 2014-10-05 | Disposition: A | Discharge status: Home / Self Care | Payer: 59 | Source: Ambulatory Visit | Attending: Gynecology | Admitting: Gynecology

## 2014-10-05 ENCOUNTER — Encounter: Payer: Self-pay | Admitting: Gynecology

## 2014-10-05 ENCOUNTER — Ambulatory Visit (INDEPENDENT_AMBULATORY_CARE_PROVIDER_SITE_OTHER): Payer: 59 | Admitting: Gynecology

## 2014-10-05 VITALS — BP 136/80 | Ht 60.0 in | Wt 132.0 lb

## 2014-10-05 DIAGNOSIS — Z01411 Encounter for gynecological examination (general) (routine) with abnormal findings: Secondary | ICD-10-CM | POA: Insufficient documentation

## 2014-10-05 DIAGNOSIS — R102 Pelvic and perineal pain: Secondary | ICD-10-CM

## 2014-10-05 DIAGNOSIS — N938 Other specified abnormal uterine and vaginal bleeding: Secondary | ICD-10-CM | POA: Diagnosis not present

## 2014-10-05 DIAGNOSIS — Z01419 Encounter for gynecological examination (general) (routine) without abnormal findings: Secondary | ICD-10-CM | POA: Diagnosis not present

## 2014-10-05 LAB — CBC WITH DIFFERENTIAL/PLATELET
Basophils Absolute: 0 10*3/uL (ref 0.0–0.1)
Basophils Relative: 1 % (ref 0–1)
Eosinophils Absolute: 0.1 10*3/uL (ref 0.0–0.7)
Eosinophils Relative: 3 % (ref 0–5)
HCT: 38.8 % (ref 36.0–46.0)
Hemoglobin: 13.2 g/dL (ref 12.0–15.0)
LYMPHS ABS: 1.2 10*3/uL (ref 0.7–4.0)
Lymphocytes Relative: 31 % (ref 12–46)
MCH: 28.1 pg (ref 26.0–34.0)
MCHC: 34 g/dL (ref 30.0–36.0)
MCV: 82.7 fL (ref 78.0–100.0)
MONOS PCT: 9 % (ref 3–12)
MPV: 10.2 fL (ref 8.6–12.4)
Monocytes Absolute: 0.4 10*3/uL (ref 0.1–1.0)
Neutro Abs: 2.2 10*3/uL (ref 1.7–7.7)
Neutrophils Relative %: 56 % (ref 43–77)
PLATELETS: 203 10*3/uL (ref 150–400)
RBC: 4.69 MIL/uL (ref 3.87–5.11)
RDW: 14.7 % (ref 11.5–15.5)
WBC: 4 10*3/uL (ref 4.0–10.5)

## 2014-10-05 LAB — TSH: TSH: 1.983 u[IU]/mL (ref 0.350–4.500)

## 2014-10-05 LAB — COMPREHENSIVE METABOLIC PANEL
ALT: 29 U/L (ref 0–35)
AST: 27 U/L (ref 0–37)
Albumin: 4.4 g/dL (ref 3.5–5.2)
Alkaline Phosphatase: 66 U/L (ref 39–117)
BUN: 12 mg/dL (ref 6–23)
CALCIUM: 9 mg/dL (ref 8.4–10.5)
CHLORIDE: 103 meq/L (ref 96–112)
CO2: 25 mEq/L (ref 19–32)
Creat: 0.65 mg/dL (ref 0.50–1.10)
Glucose, Bld: 86 mg/dL (ref 70–99)
POTASSIUM: 3.8 meq/L (ref 3.5–5.3)
Sodium: 139 mEq/L (ref 135–145)
Total Bilirubin: 0.6 mg/dL (ref 0.2–1.2)
Total Protein: 7.4 g/dL (ref 6.0–8.3)

## 2014-10-05 LAB — LIPID PANEL
CHOL/HDL RATIO: 5.9 ratio
Cholesterol: 212 mg/dL — ABNORMAL HIGH (ref 0–200)
HDL: 36 mg/dL — ABNORMAL LOW (ref >=46)
LDL Cholesterol: 112 mg/dL — ABNORMAL HIGH (ref 0–99)
TRIGLYCERIDES: 321 mg/dL — AB (ref <150)
VLDL: 64 mg/dL — ABNORMAL HIGH (ref 0–40)

## 2014-10-05 MED ORDER — MEGESTROL ACETATE 40 MG PO TABS: 40.0000 mg | ORAL_TABLET | Freq: Two times a day (BID) | ORAL | Status: DC

## 2014-10-05 NOTE — Patient Instructions (Signed)
Megestrol tablets Qu es este medicamento? El MEGESTROL pertenece a un grupo de medicamentos denominados progesteronas. Las tabletas de megestrol se utilizan en el tratamiento del cncer de mama y del cncer endometrial avanzado. Este medicamento puede ser utilizado para otros usos; si tiene alguna pregunta consulte con su proveedor de atencin mdica o con su farmacutico. MARCAS COMERCIALES DISPONIBLES: Megace Qu le debo informar a mi profesional de la salud antes de tomar este medicamento? Necesita saber si usted presenta alguno de los siguientes problemas o situaciones: -problemas de la glndula suprarrenal -antecedentes de problemas de coagulacin de la sangre en los vasos sanguneos de las piernas, pulmones u otras partes del cuerpo -diabetes -enfermedad renal -enfermedad heptica -derrame cerebral -una reaccin alrgica o inusual al megestrol, a otros medicamentos, alimentos, colorantes o conservantes -si est embarazada o buscando quedar embarazada -si est amamantando a un beb Cmo debo utilizar este medicamento? Tome este medicamento por va oral. Siga las instrucciones de la etiqueta del [place]. No tome su medicamento con una frecuencia mayor a la indicada. Tome sus dosis a intervalos regulares. No deje de tomarlo excepto si as lo indica su mdico o su profesional de [employer]	[place]. Hable con su pediatra para informarse acerca del uso de este medicamento en nios. Puede requerir atencin especial. Sobredosis: Pngase en contacto inmediatamente con un centro toxicolgico o una sala de urgencia si usted cree que haya tomado demasiado medicamento. ATENCIN: [employer] es solo para usted. No comparta este medicamento con nadie. Qu sucede si me olvido de una dosis? Si olvida una dosis, tmela lo antes posible. Si es casi la hora de la prxima dosis, tome slo esa dosis. No tome dosis adicionales o dobles. Qu puede interactuar con este medicamento? No tome esta medicina  con ninguno de los siguientes medicamentos: -dofetilida Esta medicina tambin puede interactuar con los siguientes medicamentos: -carbamazepina -indinavir -fenobarbital -fenitona -primidona -rifampicina -warfarina Puede ser que esta lista no menciona todas las posibles interacciones. Informe a su profesional de [employer]	[place] de [employer] productos a base de hierbas, medicamentos de [place] o suplementos nutritivos que est tomando. Si usted fuma, consume bebidas alcohlicas o si utiliza drogas ilegales, indqueselo tambin a su profesional de [employer]	[place]. Algunas sustancias pueden interactuar con su medicamento. A qu debo estar atento al usar [employer]? Visite a su mdico o a su profesional de la salud para chequear su evolucin peridicamente. Contine tomando este medicamento aun cuando se sienta mejor. Puede ser necesario que utilice este medicamento durante 2 meses en forma regular antes de saber si est actuando. Si es Art therapist y est en edad de Media planner, utilice un mtodo anticonceptivo eficaz mientras est tomando este medicamento. Las mujeres que estn embarazadas o amamantando a un beb, no deben Solicitor. Existe la posibilidad de efectos secundarios graves a un beb sin nacer o un lactante. Para ms informacin hable con su profesional de la salud o su Development worker, international aid. Si es diabtico, este medicamento puede afectar sus niveles de Dispensing optician. Chequear su nivel de azcar en la sangre y consulte a su mdico o su profesional de la salud si nota algn cambio. Qu efectos secundarios puedo tener al [employer] este medicamento? Efectos secundarios que debe informar a su mdico o a Barrister's clerk de la salud tan pronto como sea posible: -dificultad al respirar o falta de aliento -dolor en el pecho -mareos -retencin de lquidos -aumento de la presin sangunea -dolor o hinchazn en [place] y vmito -erupcin cutnea y [place]  Efectos  secundarios que, por lo general, no requieren atencin mdica (debe informarlos a su mdico o a su profesional de la salud si persisten o si son molestos): -problemas de sangrado menstrual importante -sofocos o enrojecimiento -aumento de apetito -cambios en estados de nimo -sudoracin -aumento de peso Puede ser que esta lista no menciona todos los posibles efectos secundarios. Comunquese a su mdico por asesoramiento mdico [employer]. Usted puede informar los efectos secundarios a la FDA por telfono al 1-800-FDA-1088. Dnde debo guardar mi medicina? Mantngala fuera del alcance de los nios. Gurdela a una temperatura ambiente controlada de entre 15 y 24 grados C (33 y 13 grados F). Protjala del calor. No la exponga a temperaturas superiores a los 40 grados C (104 grados F). Deseche los medicamentos que no haya utilizado, despus de la fecha de vencimiento. ATENCIN: Este folleto es un resumen. Puede ser que no cubra toda la posible informacin. Si usted tiene preguntas acerca de esta medicina, consulte con su mdico, su farmacutico o su profesional de Technical sales engineer.  2015, Elsevier/Gold Standard. (2006-06-10 16:11:00) Ecografa transvaginal (Transvaginal Ultrasound) La ecografa transvaginal es una ecografa plvica en la que se utiliza una probeta metlica que se coloca en la vagina, para observar los rganos femeninos. El ecgrafo enva ondas sonoras desde un transductor (sonda). Estas ondas sonoras chocan contra las estructuras del cuerpo (como un eco) y crean Proofreader. La imagen se observa en un monitor. Se denomina transvaginal debido a que la sonda se inserta dentro de la vagina. Puede haber una pequea molestia por la introduccin de la sonda. Esta prueba tambin puede realizarse [employer]. La ecografa endovaginal es otro nombre para la ecografa transvaginal. En una ecografa transabdominal, la sonda se coloca en la parte externa del abdomen. Este mtodo no  ofrece imgenes tan buenas como la tcnica transvaginal. La ecogafa transvaginal se utiliza para observar alteraciones en el tracto genital femenino. Entre ellos se incluyen:  Problemas de infertilidad.  Malformaciones congnitas (defecto de nacimiento) del tero y los ovarios.  Tumores en el tero.  Hemorragias anormales.  Tumores y quistes de ovario.  Abscesos (tejidos inflamados y pus) en la pelvis.  Dolor abdominal o plvico sin causa aparente.  Infecciones plvicas. DURANTE EL EMBARAZO, SE UTILIZA PAR OBSERVAR:  Embarazos normales.  Un embarazo ectpico (embarazo fuera del tero).  Latidos cardacos fetales.  Anormalidades de la pelvis que no se observan bien con la ecografa transabdominal.  Sospecha de gemelos o embarazo mltiple.  Aborto inminente.  Problemas en el cuello del tero (cuello incompetente, no permanece cerrado para contener al beb).  Cuando se realiza una amniocentesis (se retira lquido de la bolsa [place], para ser [place]).  Al buscar anormalidades en el beb.  Para controlar el crecimiento, el desarrollo y la edad del feto.  Para medir la cantidad de lquido en el saco amnitico.  Cuando se realiza una versin externa del beb (se lo mueve a Programmer, applications).  Evaluar al beb en embarazos de alto riesgo (perfil biofsico).  Si se sospecha el deceso del beb (muerte). En algunos casos, se utiliza un mtodo especial denominado ecografa con infusin salina, para una observacin ms precisa del tero. Se inyecta solucin salina (agua con sal) dentro del tero en pacientes no embarazadas para observar mejor su interior. Este mtodo no se Designer, fashion/clothing. La probeta tambin puede usarse para obtener biopsias de Educational psychologist, para drenar lquido de quistes de ovario y para Designer, jewellery un DIU (dispositivo intrauterino para  el control de la natalidad) que no [place]. PREPARACIN PARA LA PRUEBA La ecografa transvaginal  se realiza con la vejiga vaca. La ecografa transabdominal se realiza con la vejiga llena. Podrn solicitarle que beba varios vasos de agua antes del examen. En algunos casos se realiza una ecografa transabdominal antes de la ecografa transvaginal para obervar los rganos del abdomen. PROCEDIMIENTO  Deber acostarse en una cama, con las rodillas dobladas y los pies en los estribos. La probeta se cubre con un condn. Dentro de la vagina y en la probeta se aplica un lubricante estril. El lubricante ayuda a transmitir las ondas sonoras y Fish farm manager la irritacin de la vagina. El mdico mover la sonda en el interior de la cavidad vaginal para escanear las estructuras plvicas. Un examen normal mostrar una pelvis normal y contenidos normales en su interior. Una prueba anormal mostrar anormalidades en la pelvis, la placenta o el beb. LAS CAUSAS DE UN RESULTADO ANORMAL PUEDEN SER:  Crecimientos o tumores en:  El tero.  Los ovarios.  La vagina.  Otras estructuras plvicas.  Crecimientos no cancerosos en el tero y los ovarios.  El ovario se retuerce y se corta el suministro de [place] (torsin [place]).  Las reas de infeccin incluyen:  Enfermedad inflamatoria plvica.  Un absceso en la pelvis.  Ubicacin de un DIU. LOS PROBLEMAS QUE PUEDEN HALLARSE EN UNA MUJER EMBARAZADA SON:  Embarazo ectpico (embarazo fuera del tero).  Embarazos mltiples.  Dilatacin (apertura) precoz anormal del cuello del tero. Esto puede indicar un cuello incompetente y Biomedical scientist.  Aborto inminente.  Muerte fetal.  Los problemas con la placenta incluyen:  La placenta se ha desarrollado sobre la abertura del cuello del tero (placenta previa).  La placenta se ha separado anticipadamente en el tero (abrupcin placentaria).  La placenta se desarrolla en el msculo del tero (placenta acreta).  Tumores del Media planner, incluyendo la enfermedad trofoblstica gestacional. Se trata de un embarazo  anormal en el que no hay feto. El tero se llena de quistes similares a uvas que en algunos casos son cancerosos.  Posicin incorrecta del feto (de nalgas, de vrtice).  Retraso del desarrollo fetal intrauterino (escaso desarrollo en el tero).  Anormalidades o infeccin fetal. RIESGOS Y COMPLICACIONES No hay riesgos conocidos para la ecografa. No se toman radiografas cuando se realiza una ecografa. Document Released: 10/09/2008 Document Revised: 09/15/2011 [facility] Patient Information 2015 [place]. This information is not intended to replace advice given to you by your health care provider. Make sure you discuss any questions you have with your health care provider. Biopsia de endometrio - Music therapist (Endometrial Biopsy, Care After) Siga estas instrucciones durante las prximas semanas. Estas indicaciones le proporcionan informacin general acerca de cmo deber cuidarse despus del procedimiento. El mdico tambin podr darle instrucciones ms especficas. El tratamiento se ha planificado de acuerdo a las prcticas mdicas actuales, pero a veces se producen problemas. Comunquese con el mdico si tiene algn problema o tiene dudas despus del procedimiento. QU ESPERAR DESPUS DEL PROCEDIMIENTO Despus del procedimiento, es tpico tener las siguientes sensaciones:  Sentir clicos leves y tendr una pequea cantidad de sangrado vaginal durante algunos das despus del procedimiento. Esto es normal. INSTRUCCIONES PARA EL CUIDADO EN EL HOGAR  Tome slo medicamentos de venta libre o recetados, segn las indicaciones del mdico.  No utilice tampones, duchas vaginales ni tenga relaciones sexuales hasta que el profesional la autorice.  Siga las indicaciones del mdico relacionadas con la restriccin a ciertas actividades, como ejercicios fsicos  intensos o levantar objetos pesados. SOLICITE ATENCIN MDICA SI:  Tiene un sangrado abundante o sangra durante ms de 2 das  despus del procedimiento.  Advierte un olor ftido que proviene de la vagina.  Siente escalofros o tiene fiebre.  Siente un dolor en el bajo vientre (abdominal) muy intenso. SOLICITE ATENCIN MDICA DE INMEDIATO SI:  Siente clicos intensos en el estmago o en la espalda.  Elimina cogulos grandes.  La hemorragia aumenta.  Se siente mareada, dbil, o se desmaya. Document Released: 04/13/2013 [facility] Patient Information 2015 [place]. This information is not intended to replace advice given to you by your health care provider. Make sure you discuss any questions you have with your health care provider.

## 2014-10-05 NOTE — Progress Notes (Signed)
Yvonne Romero [birth year] [id number]   History:    44 y.o.  for annual gyn exam with several complaints. Patient states that for the past 3 weeks she's been complaining of spotting. Patient with past history of tubal sterilization procedure. Also for years she is complaining of on and off nonspecific left lower quadrant discomfort perhaps near the time of ovulation. Patient past has history of left ovarian cyst which has resolved spontaneously. Patient did pass has had complaints of left-sided neck pain and discomfort and she did have a normal thyroid ultrasound last year as well as normal thyroid function test. Patient reports no past history of any abnormal Pap smear. She did receive her Tdap vaccine 2014. Patient has not had her mammogram yet.  Past medical history,surgical history, family history and social history were all reviewed and documented in the EPIC chart.  Gynecologic History Patient's last menstrual period was 09/17/2014. Contraception: tubal ligation Last Pap: 2014. Results were: normal Last mammogram: No prior study. Results were: No prior study  Obstetric History OB History  Gravida Para Term Preterm AB SAB TAB Ectopic Multiple Living  2 2 2       2     # Outcome Date GA Lbr Len/2nd Weight Sex Delivery Anes PTL Lv  2 Term     F CS-Unspec  N Y  1 Term     M CS-Unspec  N Y       ROS: A ROS was performed and pertinent positives and negatives are included in the history.  GENERAL: No fevers or chills. HEENT: No change in vision, no earache, sore throat or sinus congestion. NECK: No pain or stiffness. CARDIOVASCULAR: No chest pain or pressure. No palpitations. PULMONARY: No shortness of breath, cough or wheeze. GASTROINTESTINAL: No abdominal pain, nausea, vomiting or diarrhea, melena or bright red blood per rectum. GENITOURINARY: No urinary frequency, urgency, hesitancy or dysuria. MUSCULOSKELETAL: No joint or muscle pain, no back pain, no recent trauma. DERMATOLOGIC: No  rash, no itching, no lesions. ENDOCRINE: No polyuria, polydipsia, no heat or cold intolerance. No recent change in weight. HEMATOLOGICAL: No anemia or easy bruising or bleeding. NEUROLOGIC: No headache, seizures, numbness, tingling or weakness. PSYCHIATRIC: No depression, no loss of interest in normal activity or change in sleep pattern.     Exam: chaperone present  BP 136/80 mmHg  Ht 5' (1.524 m)  Wt 132 lb (59.875 kg)  BMI 25.78 kg/m2  LMP 09/17/2014  Body mass index is 25.78 kg/(m^2).  General appearance : Well developed well nourished female. No acute distress HEENT: Eyes: no retinal hemorrhage or exudates,  Neck supple, trachea midline, no carotid bruits, no thyroidmegaly Lungs: Clear to auscultation, no rhonchi or wheezes, or rib retractions  Heart: Regular rate and rhythm, no murmurs or gallops Breast:Examined in sitting and supine position were symmetrical in appearance, no palpable masses or tenderness,  no skin retraction, no nipple inversion, no nipple discharge, no skin discoloration, no axillary or supraclavicular lymphadenopathy Abdomen: no palpable masses or tenderness, no rebound or guarding Extremities: no edema or skin discoloration or tenderness  Pelvic:  Bartholin, Urethra, Skene Glands: Within normal limits             Vagina: No gross lesions or discharge, menstrual blood present  Cervix: No gross lesions or discharge  Uterus  anteverted, normal size, shape and consistency, slight tenderness left lower quadrant no rebound or guarding  Adnexa  Without masses or tenderness  Anus and perineum  normal   Rectovaginal  normal sphincter tone without palpated masses or tenderness             Hemoccult not indicated     Assessment/Plan:  44 y.o. female for annual exam will return back to the office in 1-2 weeks for sonohysterogram to rule out any intracavitary defects contributing to her intermenstrual spotting. Patient was counseled and underwent an endometrial biopsy  here in the office today results pending. Patient is fasting today so the following screening labs will be ordered today: Fasting lipid profile, comprehensive metabolic panel, TSH, CBC and urinalysis. A Pap smear was done today. Patient will be prescribed Megace 40 mg to take 1 by mouth twice a day for 10 days to stop her bleeding. Patient was provided with a requisition to schedule her mammogram. She was recommended to do her monthly breast exam.   Ok Edwards MD, 11:28 AM 10/05/2014

## 2014-10-06 LAB — URINALYSIS W MICROSCOPIC + REFLEX CULTURE
BACTERIA UA: NONE SEEN
Bilirubin Urine: NEGATIVE
CRYSTALS: NONE SEEN
Casts: NONE SEEN
Glucose, UA: NEGATIVE mg/dL
Hgb urine dipstick: NEGATIVE
Ketones, ur: NEGATIVE mg/dL
Leukocytes, UA: NEGATIVE
NITRITE: NEGATIVE
Protein, ur: NEGATIVE mg/dL
SPECIFIC GRAVITY, URINE: 1.023 (ref 1.005–1.030)
Urobilinogen, UA: 0.2 mg/dL (ref 0.0–1.0)
pH: 8 (ref 5.0–8.0)

## 2014-10-09 ENCOUNTER — Other Ambulatory Visit: Payer: Self-pay | Admitting: Gynecology

## 2014-10-09 DIAGNOSIS — N938 Other specified abnormal uterine and vaginal bleeding: Secondary | ICD-10-CM

## 2014-10-09 LAB — CYTOLOGY - PAP

## 2014-10-10 ENCOUNTER — Telehealth: Payer: Self-pay | Admitting: *Deleted

## 2014-10-10 NOTE — Telephone Encounter (Signed)
-----   Message from Keenan BachelorKatherine R Annas, [place]RMA sent at 10/06/2014  3:34 PM EDT ----- Regarding: FW: nurse call Per Dr. Glenetta HewJF " Please inform patient that her lipid profile indicates that she needs to be placed on medication because her elevated cholesterol panel. Please make an appointment for her to see Dr. Elgie CongoStephen Daub who she has seen before for consultation to discuss treatment options."  Debarah [name] spoke with the patient. Please see below. Thanks!   ----- Message -----    From: Jerilynn Mageslaudia Shaffer    Sent: 10/06/2014   3:05 PM      To: Aura CampsJennifer L Webb, Keenan BachelorKatherine R Annas, RMA Subject: nurse call                                     Patient informed. Patient wants office to make appointment with Dr Cleta Albertsaub any day or time works for her but she does have two appointments that she can not change (April 7 and April 11), Patient does not remember Dr Ellis Parentsaub's office so if I can get address and phone # so I can provide to patient. Thx

## 2014-10-10 NOTE — Telephone Encounter (Signed)
Appointment scheduled on 10/16/14 @ 8:00am with N.P.Debbie Gesser at same location [facility]. Yvonne [name] will relay to patient.

## 2014-10-16 ENCOUNTER — Ambulatory Visit (INDEPENDENT_AMBULATORY_CARE_PROVIDER_SITE_OTHER): Payer: 59

## 2014-10-16 ENCOUNTER — Ambulatory Visit (INDEPENDENT_AMBULATORY_CARE_PROVIDER_SITE_OTHER): Payer: 59 | Admitting: Family Medicine

## 2014-10-16 ENCOUNTER — Other Ambulatory Visit: Payer: Self-pay | Admitting: Gynecology

## 2014-10-16 ENCOUNTER — Encounter: Payer: Self-pay | Admitting: Family Medicine

## 2014-10-16 ENCOUNTER — Ambulatory Visit (INDEPENDENT_AMBULATORY_CARE_PROVIDER_SITE_OTHER): Payer: 59 | Admitting: Gynecology

## 2014-10-16 VITALS — BP 122/77 | HR 58 | Temp 98.0°F | Resp 16 | Ht 60.5 in | Wt 132.8 lb

## 2014-10-16 DIAGNOSIS — N8300 Follicular cyst of ovary, unspecified side: Secondary | ICD-10-CM

## 2014-10-16 DIAGNOSIS — M542 Cervicalgia: Secondary | ICD-10-CM | POA: Diagnosis not present

## 2014-10-16 DIAGNOSIS — N938 Other specified abnormal uterine and vaginal bleeding: Secondary | ICD-10-CM

## 2014-10-16 DIAGNOSIS — R102 Pelvic and perineal pain: Secondary | ICD-10-CM

## 2014-10-16 DIAGNOSIS — N923 Ovulation bleeding: Secondary | ICD-10-CM

## 2014-10-16 DIAGNOSIS — R1032 Left lower quadrant pain: Secondary | ICD-10-CM

## 2014-10-16 DIAGNOSIS — N939 Abnormal uterine and vaginal bleeding, unspecified: Secondary | ICD-10-CM

## 2014-10-16 DIAGNOSIS — E785 Hyperlipidemia, unspecified: Secondary | ICD-10-CM | POA: Diagnosis not present

## 2014-10-16 MED ORDER — ATORVASTATIN CALCIUM 10 MG PO TABS: 10.0000 mg | ORAL_TABLET | Freq: Every day | ORAL | Status: DC

## 2014-10-16 MED ORDER — IBUPROFEN 800 MG PO TABS: 800.0000 mg | ORAL_TABLET | Freq: Three times a day (TID) | ORAL | Status: DC | PRN

## 2014-10-16 NOTE — Progress Notes (Signed)
Subjective:    Patient ID: Yvonne Romero, female    DOB: 22-Nov-1970, 44 y.o.   MRN: [id number]  HPI This is a 44 yo female who presents today for follow up of hyperlipidemia which was picked up by her gynecologist on annual exam. She presents today to discuss treatment. She is spanish speaking and is accompanied by an interpreter- Rene KocherEdwardo Scbalvarro.  She also has chronic right sided neck pain since 2001, and swelling. She had a negative thyroid US as well as labs. She has pain with moving her neck from side to side. Does not hurt every daily. She has to turn her head a lot with her work, she thinks this may make it swell. 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 She likes her job.  Never takes medicine for pain or uses heat or ice. Has ibuprofen 800 mg listed on her medication list but she is not currently taking and doesn't think she has any more at home.   She was seen by Dr. Lily PeerFernandez for metorrhagia, she took megace x 2 doses and her menses stopped.  She occasionally has chest pain and tightness with eating french fries. This also happens sometimes when she is restricting her movement with her work. Resolves spontaneously and with stretching.    She lives at home with her children (13,9) and her children's uncle. She sleeps well. Has some sadness, not every day. Looks forward to her day. On her time off, she takes care of her house and visits with her girlfriends. Denies suicidal ideation.    No past medical history on file. Past Surgical History  Procedure Laterality Date  . Cesarean section      x2  . Tubal ligation     Family History  Problem Relation Age of Onset  . Diabetes Mother   . Cancer Sister     uterine   History  Substance Use Topics  . Smoking status: Never Smoker   . Smokeless tobacco: Never Used  . Alcohol Use: No    Review of Systems No fever, no difficulty swallowing, no acid reflux, no stomach pain. No numbness, no  tingling no weakness, no falls.     Objective:   Physical Exam  Constitutional: She is oriented to person, place, and time. She appears well-developed and well-nourished.  HENT:  Head: Normocephalic and atraumatic.  Right Ear: Tympanic membrane, external ear and ear canal normal.  Left Ear: Tympanic membrane and ear canal normal.  Nose: Nose normal.  Mouth/Throat: Oropharynx is clear and moist.  Eyes: Conjunctivae are normal. Pupils are equal, round, and reactive to light.  Neck: Normal range of motion and full passive range of motion without pain. Neck supple. Muscular tenderness (tender over left trapezius) present. No spinous process tenderness present. No edema, no erythema and normal range of motion present. No thyromegaly present.  Cardiovascular: Normal rate, regular rhythm and normal heart sounds.   Pulmonary/Chest: Effort normal and breath sounds normal.  Musculoskeletal: Normal range of motion. She exhibits no edema or tenderness.       Cervical back: Normal.  Lymphadenopathy:    She has no cervical adenopathy.  Neurological: She is alert and oriented to person, place, and time.  Skin: Skin is warm and dry.  Psychiatric: She has a normal mood and affect. Her behavior is normal. Judgment and thought content normal.  Vitals reviewed. BP 122/77 mmHg  Pulse 58  Temp(Src) 98 F (36.7 C) (Oral)  Resp 16  Ht 5' 0.5" (1.537 m)  Wt 132 lb 12.8 oz (60.238 kg)  BMI 25.50 kg/m2  SpO2 98%  LMP 09/17/2014 (Exact Date)     Assessment & Plan:  1. Hyperlipidemia - Provided written and verbal information regarding diagnosis and treatment. - atorvastatin (LIPITOR) 10 MG tablet; Take 1 tablet (10 mg total) by mouth daily.  Dispense: 30 tablet; Refill: 5  2. Neck pain - this is an intermittent, chronic problem in a patient who uses her arms to do very fast repetitive motions - encouraged her to use ibuprofen as prescribed, perform frequent ROM exercises throughout her work day, try  heat/ice  - ibuprofen (ADVIL,MOTRIN) 800 MG tablet; Take 1 tablet (800 mg total) by mouth every 8 (eight) hours as needed.  Dispense: 30 tablet; Refill: 1  - follow up in 6 months, sooner if worsening pain.   Emi Belfast, FNP-BC  [facility], [facility]  10/16/2014 9:08 PM

## 2014-10-16 NOTE — Progress Notes (Signed)
   Patient presented to the office today to discuss her ultrasound/sono hysterogram that was ordered as a result of the patient having complaining of some spotting for 3 weeks prior to her annual gynecological examination on 10/05/2014.Patient with past history of tubal sterilization procedure. Also for years she is complaining of on and off nonspecific left lower quadrant discomfort perhaps near the time of ovulation. Patient past has history of left ovarian cyst which has resolved spontaneously. In addition to the scan on a fasting state her fasting lipid profile had demonstrated her total cholesterol was elevated at 212 and her triglycerides elevated at 321 and her LDL was elevated at 112 and was referred to her PCP. The rest her blood tests consisting of a comprehensive metabolic panel, CBC, TSH, urinalysis and Pap smear were all normal.Patient's with prior tubal ligation.  Endometrial biopsy done on 10/05/2014 demonstrated the following: Diagnosis Endometrium, biopsy, uterus - PROLIFERATIVE PHASE ENDOMETRIUM WITH FEATURES OF BREAKDOWN. - NEGATIVE FOR HYPERPLASIA OR MALIGNANCY. - SCANT FRAGMENTS OF BENIGN ENDOCERVICAL MUCOSA.  Ultrasound today: Uterus measured 8.7 x 6.3 x 4.2 cm with endometrial stripe of 8.4 mm. Right ovary normal left ovary several follicles the largest one measuring 20 mm. No fluid in the cul-de-sac. After was a services cleansed with Betadine solution and a sterile catheter was introduced into the uterine cavity and normal saline was instilled and no intracavitary defect was noted.  Assessment/plan: Isolated event of intermenstrual spotting. Negative workup consisting of endometrial biopsy an HSG. An annual exam she was noted to have hyperlipidemia was referred to PCP who started her on Lipitor. I've asked the patient to monitor her cycles for the next 6 months and if this becomes a repetitive issue to return to the office for follow-up.

## 2014-10-16 NOTE — Patient Instructions (Addendum)
Please take cholesterol medicine daily and avoid sugary drinks, juice, sweet tea. Also limit white bread, white rice, white potatoes and white pasta. Eat lots of vegetables and fruits.   Follow up in 6 months. Please come in fasting for at least 12 hours.   Colesterol (Cholesterol) El colesterol es una sustancia blanca y cerosa parecida a la grasa que el organismo necesita en pequeas cantidades. El hgado fabrica todo el colesterol que necesita. La sangre lo transporta desde el hgado a travs de los vasos sanguneos. Los depsitos de colesterol (placa) pueden acumularse en las paredes de los vasos sanguneos, lo que ocasiona el estrechamiento y la rigidez de las arterias. Las placas de colesterol aumentan el riesgo de ataque cardaco e ictus.  Aunque sea muy elevado, la concentracin de colesterol no puede percibirse. La nica forma de saber que tiene colesterol alto es mediante un anlisis de [place]. Una vez que se conocen las concentraciones de Oncologist, se Tourist information centre manager un registro de los [place]. Trabaje con el mdico para [employer] en el rango deseado.  QU SIGNIFICAN LOS RESULTADOS?  El colesterol total es una medida general de todo el colesterol en [place].  El LDL es el que se conoce como colesterol malo y es el que se deposita en las paredes de las arterias. Su concentracin debe ser baja.  El HDL es el colesterol bueno porque limpia las arterias y arrastra el LDL. Su concentracin debe ser alta.  Los triglicridos son grasas que el cuerpo puede quemar ya sea para energa o para almacenamiento. Las concentraciones altas estn estrechamente vinculadas con las enfermedades cardacas. CULES SON LAS CONCENTRACIONES DE COLESTEROL DESEADAS?  El colesterol total por debajo de 200.  El LDL por debajo de 100 en las personas en riesgo y por debajo de 70 en aquellas que corren un riesgo muy alto.  El HDL por [place] de 50es bueno y por [place] de 60 es lo  mejor.  Los triglicridos por debajo de 150. CMO PUEDO BAJAR EL COLESTEROL?  Dieta Siga los programas de alimentacin que el mdico le indique.  Elija el pescado o la carne blanca de pollo y [place], asados u horneados. Limite los cortes grasos de carne roja, los alimentos fritos y las carnes procesadas, como las salchichas y los embutidos.  Coma gran cantidad de frutas y verduras frescas.  Elija los cereales integrales, los frijoles, las pastas, las papas y los cereales.  Use solamente pequeas cantidades de aceite de oliva, maz o canola.  No coma mantequilla, [place], margarina o aceites de [place].  Evite los alimentos que contengan grasas trans.  Tome leche semidescremada o sin grasa y coma yogur y quesos bajos en grasas o sin grasa. Evite la [employer], la crema, los [place], las yemas de [place] y los quesos enteros.  Los postres sanos incluyen la torta ngel, los bocadillos de [place], las [place] con forma de [place], los caramelos duros, los helados de agua y el yogur bajo en grasa o sin grasa. Evite las [place], tortas, pasteles y [place].  Haga actividad fsica. Siga los programas de ejercicios segn las indicaciones del mdico.  Un programa regular ayuda a bajar el colesterol LDL y aumentar el HDL,  y a Art gallery manager.  Haga cosas que aumenten su nivel de [place], por [place], haga [place], salga a caminar o suba y baje las escaleras. Pregntele al mdico cmo puede aumentar la actividad en su vida cotidiana.  Medicamentos. Tome todos los [employer] indic  el mdico.  El mdico puede recetarle medicamentos para ayudar a Advertising account executivebajar el colesterol y reducir el riesgo de enfermedades cardacas.  Si tiene varios factores de riesgo, tal vez tenga que tomar medicamentos, incluso si las concentraciones son normales. Document Released: 04/02/2005 Document Revised: 11/07/2013 [facility] CenterExitCare Patient Information 2015 [place]ExitCare, MarylandLLC. This information is not intended to  replace advice given to you by your health care provider. Make sure you discuss any questions you have with your health care provider.

## 2014-11-07 ENCOUNTER — Encounter (HOSPITAL_COMMUNITY): Payer: Self-pay | Admitting: Emergency Medicine

## 2014-11-07 DIAGNOSIS — Z79899 Other long term (current) drug therapy: Secondary | ICD-10-CM | POA: Insufficient documentation

## 2014-11-07 DIAGNOSIS — R531 Weakness: Secondary | ICD-10-CM | POA: Diagnosis not present

## 2014-11-07 DIAGNOSIS — R51 Headache: Secondary | ICD-10-CM | POA: Diagnosis present

## 2014-11-07 DIAGNOSIS — G4489 Other headache syndrome: Secondary | ICD-10-CM | POA: Diagnosis not present

## 2014-11-07 LAB — CBC WITH DIFFERENTIAL/PLATELET
Basophils Absolute: 0 10*3/uL (ref 0.0–0.1)
Basophils Relative: 0 % (ref 0–1)
Eosinophils Absolute: 0.2 10*3/uL (ref 0.0–0.7)
Eosinophils Relative: 3 % (ref 0–5)
HCT: 35 % — ABNORMAL LOW (ref 36.0–46.0)
Hemoglobin: 11.7 g/dL — ABNORMAL LOW (ref 12.0–15.0)
Lymphocytes Relative: 31 % (ref 12–46)
Lymphs Abs: 1.8 10*3/uL (ref 0.7–4.0)
MCH: 28.5 pg (ref 26.0–34.0)
MCHC: 33.4 g/dL (ref 30.0–36.0)
MCV: 85.4 fL (ref 78.0–100.0)
Monocytes Absolute: 0.5 10*3/uL (ref 0.1–1.0)
Monocytes Relative: 8 % (ref 3–12)
Neutro Abs: 3.5 10*3/uL (ref 1.7–7.7)
Neutrophils Relative %: 58 % (ref 43–77)
Platelets: 200 10*3/uL (ref 150–400)
RBC: 4.1 MIL/uL (ref 3.87–5.11)
RDW: 14.1 % (ref 11.5–15.5)
WBC: 6 10*3/uL (ref 4.0–10.5)

## 2014-11-07 NOTE — ED Notes (Signed)
Pt. reports progressing headache for 3 days , denies injury , no nasuea or photophobia. Denies fever or chills.

## 2014-11-08 ENCOUNTER — Emergency Department (HOSPITAL_COMMUNITY)
Admission: EM | Admit: 2014-11-08 | Discharge: 2014-11-08 | Disposition: A | Discharge status: Home / Self Care | Payer: 59 | Attending: Emergency Medicine | Admitting: Emergency Medicine

## 2014-11-08 DIAGNOSIS — G4489 Other headache syndrome: Secondary | ICD-10-CM

## 2014-11-08 LAB — COMPREHENSIVE METABOLIC PANEL
ALT: 23 U/L (ref 14–54)
AST: 21 U/L (ref 15–41)
Albumin: 4 g/dL (ref 3.5–5.0)
Alkaline Phosphatase: 66 U/L (ref 38–126)
Anion gap: 7 (ref 5–15)
BUN: 10 mg/dL (ref 6–20)
CALCIUM: 9.1 mg/dL (ref 8.9–10.3)
CO2: 26 mmol/L (ref 22–32)
CREATININE: 0.75 mg/dL (ref 0.44–1.00)
Chloride: 107 mmol/L (ref 101–111)
GFR calc non Af Amer: >60 mL/min (ref >60)
Glucose, Bld: 125 mg/dL — ABNORMAL HIGH (ref 70–99)
Potassium: 3.5 mmol/L (ref 3.5–5.1)
SODIUM: 140 mmol/L (ref 135–145)
Total Bilirubin: 0.9 mg/dL (ref 0.3–1.2)
Total Protein: 7.4 g/dL (ref 6.5–8.1)

## 2014-11-08 MED ORDER — KETOROLAC TROMETHAMINE 30 MG/ML IJ SOLN
30.0000 mg | Freq: Once | INTRAMUSCULAR | Status: AC
  Administered 2014-11-08: 30 mg via INTRAVENOUS
  Filled 2014-11-08: qty 1

## 2014-11-08 MED ORDER — METOCLOPRAMIDE HCL 5 MG/ML IJ SOLN
10.0000 mg | Freq: Once | INTRAMUSCULAR | Status: AC
  Administered 2014-11-08: 10 mg via INTRAVENOUS
  Filled 2014-11-08: qty 2

## 2014-11-08 NOTE — Discharge Instructions (Signed)
Dolor de cabeza, preguntas frecuentes y sus respuestas °(Headaches, Frequently Asked Questions) °CEFALEAS MIGRAÑOSAS °P: ¿Qué es la migraña? ¿Qué la ocasiona? ¿Cómo puedo tratarla? °R: En general, la migraña comienza como un dolor apagado. Luego progresa hacia un dolor, constante, punzante y como un latido. Sentirá este dolor en las sienes. Podrá sentir dolor en la parte anterior o posterior de la cabeza, o en uno o ambos lados. El dolor suele estar acompañado de una combinación de: °· Náuseas. °· Vómitos. °· Sensibilidad a la luz y los ruidos. °Algunas personas (un 15%) experimentan un aura (ver abajo) antes de un ataque. La causa de la migraña se debe a reacciones químicas del cerebro. El tratamiento para la migraña puede incluir medicamentos de venta libre. También puede incluir técnicas de autoayuda. Estas incluyen entrenamientos para la relajación y biorretroalimentación.  °P: ¿Qué es un aura? °R: Alrededor del 15% de las personas con migraña tiene un "aura". Es una señal de síntomas neurológicos que ocurren antes de un dolor de cabeza por migrañas. Podrá ver líneas onduladas o irregulares, puntos o luces parpadeantes. Podrá experimentar visión de túnel o puntos ciegos en uno o ambos ojos. El aura puede incluir alucinaciones visuales o auditivas (algo que se imagina). Puede incluir trastornos en el olfato (como olores extraños), el tacto o el gusto. Entre otros síntomas se incluyen: °· Adormecimiento. °· Sensación de hormigueo. °· Dificultad para recordar o decir la palabra correcta. °Estos episodios neurológicos pueden durar hasta 60 minutos. Los síntomas desaparecerán a medida que el dolor de cabeza comience. °P:¿Qué es un disparador? °R: Ciertos factores físicos o ambientales pueden llevar a "disparar" una migraña. Estos son: °· Alimentos. °· Cambios hormonales. °· Clima. °· Estrés. °Es importante recordar que los disparadores son diferentes entre si. Para ayudar a prevenir ataques de migrañas, necesitará  descubrir cuáles son los disparadores que le afectan. Lleve un diario sobre sus dolores de cabeza. Este es un buen modo para descubrir los disparadores. El diario le ayudará en el momento de hablar con el profesional acerca de su enfermedad. °P: ¿El clima afecta en las migrañas? °R: La luz solar, el calor, la humedad y lo cambios drásticos en la presión barométrica pueden llevar a, o "disparar" un ataque de migraña en algunas personas. Pero estudios han demostrado que el clima no actúa como disparador para todas las personas con migraña. °P: ¿Cuál es la relación entre la migraña y la hormonas? °R: Las hormonas inician y regulan muchas de las funciones corporales. Las hormonas mantienen el balance en el cuerpo dentro de los constantes cambios de ambiente. Algunas veces, el nivel de hormonas en el cuerpo se desbalancea. Por ejemplo, durante la menstruación, el embarazo o la menopausia. Pueden ser la causa de un ataque de migraña. De hecho, alrededor de tres cuartos de las mujeres con migraña informan que sus ataques están relacionados con el ciclo menstrual.  °P: ¿Aumenta el riesgo de sufrir un choque cardíaco en las personas que padecen migraña? °R: La probabilidad de que un ataque de migraña ocasione un ataque cardíaco es muy remota. Esto no quiere decir que una persona que sufre de migraña no pueda tener un ataque cardíaco asociado con ella. En las personas menores de 40 años, el factor más común para un ataque es la migraña. Pero durante la vida de una persona, la ocurrencia de un dolor de cabeza por migraña está asociada con una reducción en el riesgo de morir por un ataque cerebrovascular.  °P: ¿Cuáles son los medicamentos para la migraña? °R: La   medicación precisa se utiliza para tratar el dolor de cabeza una vez que ha comenzado. Son ejemplos, medicamentos de venta libre, desinflamatorios sin esteroides, ergotamínicos y triptanos.  °P: ¿Qué son los triptanos? °R: Lo triptanos son una nueva clase de  medicamentos abortivos. Son específicos para tratar este problema. Los triptanos son vasoconstrictores. Moderan algunas reacciones químicas del cerebro. Los triptanos trabajan como receptores del cerebro. Ayudan a restaurar el balance de un neurotransmisor denominado serotonina. Se cree que las fluctuaciones en los niveles de serotonina son la causa principal de la migraña.  °P: ¿Son efectivos los medicamentos de venta libre para la migraña? °R: Los medicamentos de venta libre pueden ser efectivos para aliviar dolores leves a moderados y los síntomas asociados a la migraña. Pero deberá consultar a un médico antes de comenzar cualquier tratamiento para la migraña.  °P: ¿Cuáles son los medicamentos de prevención de la migraña? °R: Se suele denominar tratamiento "profiláctico" a los medicamentos para la prevención de la migraña. Se utilizan para reducir la frecuencia, gravedad y duración de los ataques de migraña. Son ejemplos de medicamentos de prevención: antiepilépticos, antidepresivos, bloqueadores beta, bloqueadores de los canales de calcio y medicamentos antiinflamatorios sin esteroides. °P: ¿ Por qué se utilizan anticonvulsivantes para tratar la migraña? °R: Durante los últimos años, ha habido un creciente interés en las drogas antiepilépticas para la prevención de la migraña. A menudo se los conoce como "anticonvulsivantes". La epilepsia y la migraña suceden por reacciones similares en el cerebro.  °P: ¿ Por qué se utilizan antidepresivos para tratar la migraña? °R: Los antidepresivos típicamente se utilizan para tratar a las personas con depresión. Pueden reducir la frecuencia de la migraña a través de la regulación de los niveles químicos, como la serotonina, en el cerebro.  °P: ¿ Por qué se utilizan terapias alternativas para tratar la migraña? °R: El término "terapias alternativas" suelen utilizarse para describir los tratamientos que se considera que están por fuera de alcance la medicina occidental  convencional. Son ejemplos de las terapias alternativas: la acupuntura, la acupresión y el yoga. Otra terapia alternativa común es la terapia herbal. Se cree que algunas hierbas ayudan a aliviar los dolores de cabeza. Siempre consulte con el profesional acerca de las terapias alternativas antes de utilizarlas. Algunos productos herbales contienen arsénico y otras toxinas. °DOLORES DE CABEZA POR TENSIÓN °P: ¿Qué es un dolor de cabeza por tensión? ¿Qué lo ocasiona? ¿Cómo puedo tratarlo? °R: Los dolores de cabeza por tensión ocurren al azar. A menudo son el resultado de estrés temporario, ansiedad, fatiga o ira. Los síntomas incluyen dolor en las sienes, una sensación como de tener una banda alrededor de la cabeza (un dolor que "presiona"). Los síntomas pueden incluir una sensación de empuje, de presión y contracción de los músculos de la cabeza y el cuello. El dolor comienza en la frente, sienes o en la parte posterior de la cabeza y el cuello. El tratamiento para los dolores de cabeza por tensión puede incluir medicamentos de venta libre. También puede incluir técnicas de autoayuda con entrenamientos para la relajación y biorretroalimentación. °CEFALEA EN RACIMOS °P: ¿Qué es una cefalea en racimos? ¿Qué la ocasiona? ¿Cómo puedo tratarla? °R: La cefalea en racimos toma su nombre debido a que los ataques vienen en grupos. El dolor aparece con poco o ningún aviso. Normalmente ocurre de un lado de la cabeza. Muchas veces el dolor viene acompañado de un lagrimeo u ojo rojo y goteo de la nariz del mismo lado que el dolor. Se cree que la causa es   una reacción en las sustancias químicas del cerebro. Se describe como el caso más grave e intenso de cualquier tipo de dolor de cabeza. El tratamiento incluye medicamentos bajo receta y oxígeno. °CEFALEA SINUSAL °P: ¿Qué es una cefalea sinusal? ¿Qué la ocasiona? ¿Cómo puedo tratarla? °R: Cuando se inflama una cavidad en los huesos de la cara y el cráneo (sinus) ocasiona un dolor  localizado. Esta enfermedad generalmente es el resultado de una reacción alérgica, un tumor o una infección. Si el dolor de cabeza está ocasionado por un bloqueo del sinus, como una infección, probablemente tendrá fiebre. Una imagen de rayos X confirmará el bloqueo del sinus. El tratamiento indicado por el médico podrá incluir antibióticos para la infección, y también antihistamínicos o descongestivos.  °DOLOR DE CABEZA POR EFECTO "REBOTE" °P: ¿Qué es un dolor de cabeza por efecto "rebote"? ¿Qué lo ocasiona? ¿Cómo puedo tratarlo? °R: Si se toman medicamentos para el dolor de cabeza muy a menudo puede llevar a la enfermedad conocida como "dolor de cabeza por rebote". Un patrón de abuso de medicamentos para el dolor de cabeza supone tomarlos más de dos veces por semana o en cantidades excesivas. Esto significa más que lo que indica el envase o el médico. Con los dolores de cabeza por rebote, los medicamentos no sólo dejan de aliviar el dolor sino que además comienzan a ocasionar dolores de cabeza. Los médicos tratan los dolores de cabeza por rebote mediante la disminución del medicamento del que se ha abusado. A veces el medico podrá sustituir gradualmente por un tipo diferente de tratamiento o medicación. Dejar de consumirlo podría ser difícil. El abuso regular de un medicamento aumenta el potencial que se produzcan efectos secundarios graves. Consulte con un médico si utiliza regularmente medicamentos para el dolor de cabeza más de dos días por semana o más de lo que indica el envase. °PREGUNTAS Y RESPUESTAS ADICIONALES °P: ¿Qué es la biorretroalimentación? °R: La biorretroalimentación es un tratamiento de autoayuda. La biorretroalimentación utiliza un equipamiento especial para controlar los movimientos involuntarios del cuerpo y las respuestas físicas. La biorretroalimentación controla: °· Respiración. °· Pulso. °· Latidos cardíacos. °· Temperatura. °· Tensión muscular. °· Actividad cerebrales. °La  biorretroalimentación le ayudará a mejorar y perfeccionar sus ejercicios de relajación. Aprenderá a controlar las respuestas físicas relacionadas con el estrés. Una vez que se dominan las técnicas no necesitará más el equipamiento. °P: ¿Son hereditarios los dolores de cabeza? °R: Según algunas estimaciones, aproximadamente 28 millones de estadounidenses sufren migraña. Cuatro de cada cinco (80%) informan una historia familiar de migraña. Los investigadores no pueden asegurar si se trata de un problema genético o una predisposición familiar. A pesar de esto, un niño tiene 50% de probabilidades de sufrir migraña si uno de sus padres la sufre. El niño tiene un 75% de probabilidades si ambos padres la sufren.  °P. ¿Puede un niño tener migraña? °R: En el momento de ingresar a la escuela secundaria, la mayoría de los jóvenes han experimentado algún tipo de cefalea. Algunos abordajes o medicamentos seguros y efectivos pueden evitar las cefaleas o detenerlas luego de que han comenzado.  °P. ¿Qué tipo de especialista debe ver para diagnosticar y tratar una cefalea? °R: Comience con su médico de cabecera. Converse acerca de su experiencia y abordaje de las cefaleas. Comente los métodos de clasificación, diagnóstico y tratamiento. El profesional decidirá si lo derivará a un especialista, según los síntomas u otras enfermedades. El hecho de sufrir diabetes, alergias, etc, puede requerir un abordaje más complejo. La National Headache Foundation (Fundación Nacional   para las Cefaleas) proporcionará, a pedido, una lista de los médicos que son miembros de su estado. °Document Released: 06/05/2008 Document Revised: 09/15/2011 °ExitCare® Patient Information ©2015 ExitCare, LLC. This information is not intended to replace advice given to you by your health care provider. Make sure you discuss any questions you have with your health care provider. ° °

## 2014-11-08 NOTE — ED Notes (Signed)
Dr. Wickline at bedside.  

## 2014-11-08 NOTE — ED Provider Notes (Signed)
CSN: [id number]     Arrival date & time 11/07/14  2304 History  This chart was scribed for Yvonne Rhineonald Arwilda [place], MD by Bronson CurbJacqueline Romero, ED Scribe. This patient was seen in room D32C/D32C and the patient's care was started at 12:39 AM.    Chief Complaint  Patient presents with  . Migraine    Patient is a 44 y.o. female presenting with headaches. The history is provided by the patient. A language interpreter was used ([id number]).  Headache Pain location:  Generalized Quality: aching. Radiates to:  Does not radiate Severity currently:  10/10 Onset quality:  Gradual Duration:  3 days Progression:  Unchanged Chronicity:  New Similar to prior headaches: no   Relieved by:  None tried Worsened by:  Nothing Ineffective treatments:  None tried Associated symptoms: weakness   Associated symptoms: no fever, no nausea and no vomiting      HPI Comments: Yvonne Romero is a 44 y.o. female, with no significant medical history, who presents to the Emergency Department complaining of gradual onset, 10/10, generalized headache for the past 3 days. There is associated BUE weakness. Patient denies history of prior similar headaches, stroke, recent falls, head injury, or recent travel. She further denies fever, nausea, chest pain, SOB, vomiting.   PMH - none  Past Surgical History  Procedure Laterality Date  . Cesarean section      x2  . Tubal ligation     Family History  Problem Relation Age of Onset  . Diabetes Mother   . Cancer Sister     uterine   History  Substance Use Topics  . Smoking status: Never Smoker   . Smokeless tobacco: Never Used  . Alcohol Use: No   OB History    Gravida Para Term Preterm AB TAB SAB Ectopic Multiple Living   2 2 2       2      Review of Systems  Constitutional: Negative for fever.  Respiratory: Negative for shortness of breath.   Cardiovascular: Negative for chest pain.  Gastrointestinal: Negative for nausea and vomiting.  Skin: Negative for rash.   Neurological: Positive for weakness and headaches.  All other systems reviewed and are negative.     Allergies  Review of patient's allergies indicates no known allergies.  Home Medications   Prior to Admission medications   Medication Sig Start Date End Date Taking? Authorizing Provider  atorvastatin (LIPITOR) 10 MG tablet Take 1 tablet (10 mg total) by mouth daily. 10/16/14   Yvonne Belfasteborah B Gessner, FNP  ibuprofen (ADVIL,MOTRIN) 800 MG tablet Take 1 tablet (800 mg total) by mouth every 8 (eight) hours as needed. 10/16/14   Yvonne Belfasteborah B Gessner, FNP   Triage Vitals: BP 142/78 mmHg  Pulse 71  Temp(Src) 98.8 F (37.1 C) (Oral)  Resp 16  Ht 5\' 1"  (1.549 m)  Wt 136 lb (61.689 kg)  BMI 25.71 kg/m2  SpO2 99%  LMP 10/27/2014  Physical Exam  Nursing note and vitals reviewed. CONSTITUTIONAL: Well developed/well nourished HEAD: Normocephalic/atraumatic EYES: EOMI/PERRL, no nystagmus, no ptosis, ENMT: Mucous membranes moist NECK: supple no meningeal signs, no bruits SPINE/BACK:entire spine nontender CV: S1/S2 noted, no murmurs/rubs/gallops noted LUNGS: Lungs are clear to auscultation bilaterally, no apparent distress ABDOMEN: soft, nontender, no rebound or guarding GU:no cva tenderness NEURO:Awake/alert, facies symmetric, no arm or leg drift is noted Equal 5/5 strength with shoulder abduction, elbow flex/extension, wrist flex/extension in upper extremities and equal hand grips bilaterally Equal 5/5 strength with hip flexion,knee flex/extension, foot dorsi/plantar flexion  Cranial nerves 3/4/5/6/01/12/09/11/12 tested and intact Gait normal without ataxia No past pointing Sensation to light touch intact in all extremities EXTREMITIES: pulses normal, full ROM SKIN: warm, color normal PSYCH: no abnormalities of mood noted, alert and oriented to situation    ED Course  Procedures   DIAGNOSTIC STUDIES: Oxygen Saturation is 99% on room air, normal by my interpretation.    COORDINATION  OF CARE: At [id number] Discussed treatment plan with patient which includes Toradol and Reglan. Patient agrees.   Labs Review Labs Reviewed  CBC WITH DIFFERENTIAL/PLATELET - Abnormal; Notable for the following:    Hemoglobin 11.7 (*)    HCT 35.0 (*)    All other components within normal limits  COMPREHENSIVE METABOLIC PANEL - Abnormal; Notable for the following:    Glucose, Bld 125 (*)    All other components within normal limits     Medications  metoCLOPramide (REGLAN) injection 10 mg (10 mg Intravenous Given 11/08/14 0054)  ketorolac (TORADOL) 30 MG/ML injection 30 mg (30 mg Intravenous Given 11/08/14 0054)    Pt improved No neuro deficits Appropriate for d/c home  MDM   Final diagnoses:  Other headache syndrome    Nursing notes including past medical history and social history reviewed and considered in documentation Labs/vital reviewed myself and considered during evaluation   I personally performed the services described in this documentation, which was scribed in my presence. The recorded information has been reviewed and is accurate.   \   Yvonne Rhineonald Refugia Laneve, MD 11/08/14 0200

## 2015-04-17 ENCOUNTER — Ambulatory Visit: Payer: 59 | Admitting: Family Medicine

## 2015-04-19 ENCOUNTER — Other Ambulatory Visit: Payer: 59

## 2015-04-19 ENCOUNTER — Ambulatory Visit: Payer: 59 | Admitting: Gynecology

## 2016-11-19 ENCOUNTER — Encounter: Payer: Self-pay | Admitting: Gynecology

## 2017-09-21 ENCOUNTER — Other Ambulatory Visit: Payer: Self-pay

## 2017-09-21 ENCOUNTER — Emergency Department (HOSPITAL_COMMUNITY)
Admission: EM | Admit: 2017-09-21 | Discharge: 2017-09-22 | Disposition: A | Discharge status: Home / Self Care | Payer: Self-pay | Attending: Emergency Medicine | Admitting: Emergency Medicine

## 2017-09-21 ENCOUNTER — Encounter (HOSPITAL_COMMUNITY): Payer: Self-pay | Admitting: Emergency Medicine

## 2017-09-21 ENCOUNTER — Emergency Department (HOSPITAL_COMMUNITY): Payer: Self-pay

## 2017-09-21 DIAGNOSIS — M542 Cervicalgia: Secondary | ICD-10-CM | POA: Insufficient documentation

## 2017-09-21 MED ORDER — HYDROCODONE-ACETAMINOPHEN 5-325 MG PO TABS
1.0000 | ORAL_TABLET | Freq: Once | ORAL | Status: AC
  Administered 2017-09-22: 1 via ORAL
  Filled 2017-09-21: qty 1
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
## 2017-09-21 NOTE — ED Notes (Signed)
SANE at bedside

## 2017-09-22 NOTE — SANE Note (Signed)
FNE provided patient with a paper measuring tape.  Initial measurement taken at bedside and measuring tape marked.  Through an interpreter, patient told to use the tape to measure her neck circumference every 8-10 hours for the next 48 hours.  Patient told to return to emergency department if neck becomes larger.
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
## 2019-11-01 ENCOUNTER — Other Ambulatory Visit: Payer: Self-pay

## 2019-11-01 ENCOUNTER — Emergency Department (HOSPITAL_COMMUNITY)
Admission: EM | Admit: 2019-11-01 | Discharge: 2019-11-01 | Disposition: A | Discharge status: Home / Self Care | Payer: Self-pay | Attending: Emergency Medicine | Admitting: Emergency Medicine

## 2019-11-01 ENCOUNTER — Emergency Department (HOSPITAL_COMMUNITY): Payer: Self-pay

## 2019-11-01 DIAGNOSIS — M542 Cervicalgia: Secondary | ICD-10-CM | POA: Insufficient documentation

## 2019-11-01 DIAGNOSIS — R519 Headache, unspecified: Secondary | ICD-10-CM | POA: Insufficient documentation

## 2019-11-01 MED ORDER — METHOCARBAMOL 500 MG PO TABS: 500.0000 mg | ORAL_TABLET | Freq: Three times a day (TID) | ORAL | 0 refills | Status: DC | PRN

## 2019-11-01 MED ORDER — NAPROXEN 500 MG PO TABS: 500.0000 mg | ORAL_TABLET | Freq: Two times a day (BID) | ORAL | 0 refills | Status: DC

## 2019-11-01 MED ORDER — LIDOCAINE 5 % EX PTCH: 1.0000 | MEDICATED_PATCH | CUTANEOUS | 0 refills | Status: AC

## 2019-11-01 NOTE — ED Triage Notes (Signed)
Per patient she developed a pain in back of head, radiating toward neck on Sunday 4/25,. Denies injury or any event. Patient rates pain 9/10. Taking ibuprofen without relief.
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
## 2019-11-01 NOTE — Discharge Instructions (Addendum)
You were seen in the emergency department today for a headache and neck pain.  The CT scan of your head and neck did not show any significant abnormalities.  We suspect your discomfort may be related to muscle spasm.  We are sending you home with the following medicines:  - Naproxen is a nonsteroidal anti-inflammatory medication that will help with pain and swelling. Be sure to take this medication as prescribed with food, 1 pill every 12 hours,  It should be taken with food, as it can cause stomach upset, and more seriously, stomach bleeding. Do not take other nonsteroidal anti-inflammatory medications with this such as Advil, Motrin, Aleve, Mobic, Goodie Powder, or Motrin.    - Robaxin is the muscle relaxer I have prescribed, this is meant to help with muscle tightness. Be aware that this medication may make you drowsy therefore the first time you take this it should be at a time you are in an environment where you can rest. Do not drive or operate heavy machinery when taking this medication. Do not drink alcohol or take other sedating medications with this medicine such as narcotics or benzodiazepines.   -Lidoderm patch: This is a patch to place directly over the back of your neck once per day to help with soothing the area.  You make take Tylenol per over the counter dosing with these medications.   We have prescribed you new medication(s) today. Discuss the medications prescribed today with your pharmacist as they can have adverse effects and interactions with your other medicines including over the counter and prescribed medications. Seek medical evaluation if you start to experience new or abnormal symptoms after taking one of these medicines, seek care immediately if you start to experience difficulty breathing, feeling of your throat closing, facial swelling, or rash as these could be indications of a more serious allergic reaction  Please apply heat to the area to help release  tension.  Please follow-up with your primary care provider within 3 days for reevaluation.  Return to the ER for new or worsening symptoms including but not limited to increased pain, change in intensity of pain, numbness, weakness, change in your vision, inability to keep fluids down, fever, or any other concerns.  English to [place] Google translate:  Hoy te vieron en el departamento de emergencias por dolor de cabeza y de cuello. La tomografa computarizada de su cabeza y cuello no mostr ninguna anomala significativa. Sospechamos que su malestar puede estar relacionado con un espasmo muscular. Lo enviamos a casa con los siguientes medicamentos:  - El naproxeno es un medicamento antiinflamatorio no esteroideo que ayudar con Conservation officer, historic buildings y la hinchazn. Asegrese de tomar [employer] segn lo prescrito con alimentos, 1 pastilla cada 12 horas. Debe tomarse con alimentos, ya que puede causar [employer] y, lo que es ms grave, sangrado estomacal. No tome otros medicamentos antiinflamatorios no esteroides con esto, como Advil, Motrin, Aleve, Mobic, Goodie Powder o Motrin.  - Robaxin es el relajante muscular que le he recetado, que est destinado a ayudar con la tensin muscular. Tenga en cuenta que este medicamento puede causarle somnolencia, por lo que la primera vez que lo tome debe ser en un momento en el que se encuentre en un entorno donde pueda descansar. No conduzca ni maneje maquinaria pesada mientras toma este medicamento. No beba alcohol ni tome otros medicamentos sedantes con este medicamento, como narcticos o benzodiazepinas.  -Parche de Lidoderm: este es un parche para colocar directamente sobre la parte posterior de  su cuello una vez al da para ayudar a calmar el rea.  Puede tomar Tylenol por dosis de venta libre con [employer].  Le hemos recetado nuevos medicamentos hoy. Discuta los medicamentos recetados hoy con su farmacutico, ya que pueden tener efectos adversos  e interacciones con sus otros medicamentos, incluidos los medicamentos recetados y de [place]. Busque una evaluacin mdica si comienza a experimentar sntomas nuevos o anormales despus de tomar uno de estos medicamentos, busque atencin mdica de inmediato si comienza a experimentar dificultad para respirar, sensacin de cierre de la garganta, hinchazn facial o sarpullido, ya que estos podran ser indicios de un problema ms grave reaccin alrgica  Aplique calor al rea para ayudar a liberar la tensin.  Haga un seguimiento con su proveedor de atencin primaria dentro de los 3 das para una reevaluacin. Regrese a la sala de emergencias por sntomas nuevos o que empeoran, incluidos, entre otros, aumento del [place], cambio en la intensidad del [place], entumecimiento, debilidad, cambio en su visin, incapacidad para retener lquidos, fiebre o cualquier otra inquietud.

## 2019-11-14 NOTE — Progress Notes (Signed)
Patient ID: Yvonne Romero, female   DOB: 10-Dec-1970, 49 y.o.   MRN: [id number]    Yvonne Romero, is a 49 y.o. female  NWG:[id number]  [id number]:[id number]  DOB - 1970/11/24  Subjective:  Chief Complaint and HPI: Yvonne Romero is a 49 y.o. female here today to establish care and for a follow up visit for base of head/neck pain.  meds helped a little.  No paresthesias.  NKI.  R hand dominant.  No weakness.    azhmir interpreting.    Imaging results- IMPRESSION: CT head: Study within normal limits.  CT cervical spine: No fracture or spondylolisthesis. No appreciable arthropathy. No nerve root edema or effacement. No disc extrusion or stenosis.   After ED visit 11/01/2019:  Patient presents to the ED with complaints of head/neck pain x 2 days.  She is nontoxic, resting comfortably, vitals WNL with the exception of elevated BP- will need PCP recheck.  She has no fever or nuchal rigidity to suggest meningitis. She did report somewhat quick onset to discomfort, but history of similar, benign neuro exam, low suspicion for [facility]. No visual disturbance or proptosis, doubt acute angle closure glaucoma, giant cell arteritis, or dural venous sinus thrombosis. No neuro deficits therefore doubt CVA. We discussed that with her pain being reproducible with cervical palpation- primarily paraspinal muscles, diffuse midline without focal vertebral tenderness or palpable step off, that we suspect this is muscular in nature. She is requesting imaging, risks/benefits discussed, patient opted for CT imaging- CT head & C Spine wo contrast obtained and personally reviewed & interpreted, no acute process identified. Will treat with NSAID, muscle relaxant, and Lidoderm patches.  ED/Hospital notes reviewed.    ROS:   Constitutional:  No f/c, No night sweats, No unexplained weight loss. EENT:  No vision changes, No blurry vision, No hearing changes. No mouth, throat, or ear problems.  Respiratory: No cough, No  SOB Cardiac: No CP, no palpitations GI:  No abd pain, No N/V/D. GU: No Urinary s/sx Musculoskeletal: neck/base of head pain Neuro: No headache, no dizziness, no motor weakness.  Skin: No rash Endocrine:  No polydipsia. No polyuria.  Psych: Denies SI/HI  No problems updated.  ALLERGIES: No Known Allergies  PAST MEDICAL HISTORY: No past medical history on file.  MEDICATIONS AT HOME: Prior to Admission medications   Medication Sig Start Date End Date Taking? Authorizing Provider  lidocaine (LIDODERM) 5 % Place 1 patch onto the skin daily. Apply 1 patch to the back of your neck once per day as needed to help with pain.  Remove & Discard patch within 12 hours. Patient not taking: Reported on 11/16/2019 11/01/19   Petrucelli, Pleas Koch, PA-C  meloxicam (MOBIC) 15 MG tablet Take 1 tablet (15 mg total) by mouth daily. X 7 days then prn pain 11/16/19   Anders Simmonds, PA-C  methocarbamol (ROBAXIN) 500 MG tablet Take 2 tablets (1,000 mg total) by mouth 3 (three) times daily. X 7 days then prn muscle spasm 11/16/19   Anders Simmonds, PA-C     Objective:  EXAM:   Vitals:   11/16/19 1556  BP: 125/81  Pulse: 69  SpO2: 97%  Weight: 147 lb (66.7 kg)  Height: 5' (1.524 m)    General appearance : A&OX3. NAD. Non-toxic-appearing HEENT: Atraumatic and Normocephalic.  PERRLA. EOM intact.   Chest/Lungs:  Breathing-non-labored, Good air entry bilaterally, breath sounds normal without rales, rhonchi, or wheezing  CVS: S1 S2 regular, no murmurs, gallops, rubs  Neck/base of head/skull-full S&ROM.  +  trapezius spasm that is TTP B.  BUE with normal grip, strength and ROM.  UE DTR=intact B Extremities: Bilateral Lower Ext shows no edema, both legs are warm to touch with = pulse throughout Neurology:  CN II-XII grossly intact, Non focal.   Psych:  TP linear. J/I fair. Normal speech. Appropriate eye contact and affect.  Skin:  No Rash  Data Review No results found for: HGBA1C   Assessment &  Plan   1. Screening for diabetes mellitus I have had a lengthy discussion and provided education about insulin resistance and the intake of too much sugar/refined carbohydrates.  I have advised the patient to work at a goal of eliminating sugary drinks, candy, desserts, sweets, refined sugars, processed foods, and white carbohydrates.  The patient expresses understanding.  - Hemoglobin A1c  2. Encounter for examination following treatment at hospital  3. Neck pain No red flags; imaging reassuring - Vitamin D, 25-hydroxy - meloxicam (MOBIC) 15 MG tablet; Take 1 tablet (15 mg total) by mouth daily. X 7 days then prn pain  Dispense: 30 tablet; Refill: 0 - methocarbamol (ROBAXIN) 500 MG tablet; Take 2 tablets (1,000 mg total) by mouth 3 (three) times daily. X 7 days then prn muscle spasm  Dispense: 90 tablet; Refill: 0  4. Hyperglycemia I have had a lengthy discussion and provided education about insulin resistance and the intake of too much sugar/refined carbohydrates.  I have advised the patient to work at a goal of eliminating sugary drinks, candy, desserts, sweets, refined sugars, processed foods, and white carbohydrates.  The patient expresses understanding.  - Comprehensive metabolic panel - Hemoglobin A1c  5. Language barrier AVM interpreters used and additional time performing visit was required.   Patient have been counseled extensively about nutrition and exercise  Return in about 6 weeks (around 12/28/2019) for assign PCP.  The patient was given clear instructions to go to ER or return to medical center if symptoms don't improve, worsen or new problems develop. The patient verbalized understanding. The patient was told to call to get lab results if they haven't heard anything in the next week.     Freeman Caldron, PA-C [facility] and [employer] [place], [place]   11/16/2019, 4:11 PM

## 2019-11-16 ENCOUNTER — Ambulatory Visit: Payer: Self-pay | Attending: Family Medicine | Admitting: Physician Assistant

## 2019-11-16 ENCOUNTER — Other Ambulatory Visit: Payer: Self-pay

## 2019-11-16 VITALS — BP 125/81 | HR 69 | Ht 60.0 in | Wt 147.0 lb

## 2019-11-16 DIAGNOSIS — Z131 Encounter for screening for diabetes mellitus: Secondary | ICD-10-CM

## 2019-11-16 DIAGNOSIS — R739 Hyperglycemia, unspecified: Secondary | ICD-10-CM

## 2019-11-16 DIAGNOSIS — M542 Cervicalgia: Secondary | ICD-10-CM

## 2019-11-16 DIAGNOSIS — Z09 Encounter for follow-up examination after completed treatment for conditions other than malignant neoplasm: Secondary | ICD-10-CM

## 2019-11-16 DIAGNOSIS — Z789 Other specified health status: Secondary | ICD-10-CM

## 2019-11-16 MED ORDER — METHOCARBAMOL 500 MG PO TABS: 1000.0000 mg | ORAL_TABLET | Freq: Three times a day (TID) | ORAL | 0 refills | Status: AC

## 2019-11-16 MED ORDER — MELOXICAM 15 MG PO TABS: 15.0000 mg | ORAL_TABLET | Freq: Every day | ORAL | 0 refills | Status: AC

## 2019-11-16 NOTE — Patient Instructions (Signed)
Hiperglucemia Hyperglycemia La hiperglucemia se produce cuando el nivel de azcar (glucosa) en la sangre es demasiado alto. Puede suceder que no cause sntomas. Si tiene sntomas, estos pueden incluir seales de advertencia, por ejemplo, las siguientes:  Estar ms sediento que lo habitual.  Hambre.  Cansancio.  Necesidad de Garment/textile technologist con mayor frecuencia que lo normal.  Visin borrosa. Si la afeccin empeora, puede presentar otros sntomas, como los siguientes:  Morris.  Falta de hambre (inapetencia).  Aliento con [employer] a fruta.  Debilidad.  Aumento o prdida de peso no planificados. Probablemente pierda de peso muy rpidamente.  Hormigueo o adormecimiento en las manos o los pies.  Dolor de [place].  Cuando se pellizca la piel, esta no vuelve rpidamente a su lugar al soltarla (turgencia cutnea deficiente).  Dolor en el vientre (abdomen).  Cortes o moretones que tardan en curarse. La hiperglucemia puede presentarse tanto en personas que tienen diabetes como en las que no la tienen. Puede desarrollarse despacio o rpidamente y ser una emergencia mdica. Siga estas indicaciones en su casa: Instrucciones generales  [employer] de venta libre y los recetados solamente como se lo haya indicado el mdico.  No consuma productos que contengan nicotina o tabaco, como cigarrillos y Psychologist, sport and exercise. Si necesita ayuda para dejar de fumar, consulte al mdico.  Limite el consumo de alcohol a no ms de 1 medida por da si es mujer y no est Music therapist, y a 2 medidas si es hombre. Una medida equivale a 12oz (310ml) de cerveza, 5oz (156ml) de vino o 1oz (35ml) de bebidas alcohlicas de alta graduacin.  Controle el estrs. Si necesita ayuda para lograrlo, consulte al mdico.  Concurra a todas las visitas de control como se lo haya indicado el mdico. Esto es importante. Comida y bebida   Mantenga un peso saludable.  Haga ejercicios con regularidad como se  lo haya indicado el mdico.  Beba la cantidad suficiente de lquido, especialmente si: ? Realiza actividad fsica. ? Se enferma. ? El clima es caluroso.  Consuma alimentos saludables, por ejemplo: ? Protenas con bajo contenido de grasa [place]). ? Carbohidratos complejos, como panes integrales o arroz integral. ? Lambert Mody y verduras frescas. ? Productos lcteos descremados. ? Grasas saludables.  Beba suficiente lquido como para mantener el pis (orina) claro o de color amarillo plido. Si usted tiene diabetes:   Asegrese de Charity fundraiser de la hiperglucemia.  Siga el plan de control de la diabetes como se lo haya indicado el mdico. Haga lo siguiente: ? Aplquese la insulina y tome los medicamentos como se lo hayan indicado. ? Siga el plan de ejercicio. ? Siga el plan de comidas. Coma a horario. No se saltee comidas. ? Contrlese el nivel de azcar en la sangre con la frecuencia que le hayan indicado. Contrlese antes y despus de hacer actividad fsica. Si hace ejercicio durante ms tiempo o de [employer] de lo habitual, asegrese de Chief Technology Officer su nivel de azcar en la sangre con mayor frecuencia. ? Siga el plan para los das de enfermedad cuando no pueda comer o beber normalmente. Arme este plan de antemano con el mdico.  Comparta su plan de control de la diabetes con sus compaeros de trabajo y de la escuela, y con las otras personas que viven en su hogar.  Contrlese las cetonas en la orina cuando est enfermo y [place] se lo haya indicado el mdico.  Lleve consigo una tarjeta, o use un brazalete o una medalla que indiquen que es diabtico.  Comunquese con un mdico si:  El nivel de azcar en la sangre est por encima de 240mg/dl (13,3mmol/l) durante 2das seguidos.  Tiene problemas para mantener el nivel de azcar en la sangre dentro del intervalo deseado.  El nivel de azcar en la sangre le aumenta a menudo. Solicite ayuda de inmediato si:  Tiene dificultad  para respirar.  Tiene cambios en la manera de sentirse, pensar o actuar (estado mental).  Tiene ganas constantes de vomitar (nuseas).  No puede dejar de vomitar. Estos sntomas pueden indicar una emergencia. No espere hasta que los sntomas desaparezcan. Solicite atencin mdica de inmediato. Comunquese con el servicio de emergencias de su localidad (911 en los [place]). No conduzca por sus propios medios hasta el hospital. Resumen  La hiperglucemia se produce cuando el nivel de azcar (glucosa) en la sangre es demasiado alto.  La hiperglucemia puede presentarse tanto en personas que tienen diabetes como en las que no la tienen.  Asegrese de beber la cantidad suficiente de lquido, de comer alimentos saludables y de hacer actividad fsica con regularidad.  Comunquese con el mdico si tiene problemas para mantener el nivel de azcar en la sangre dentro del intervalo deseado. Esta informacin no tiene como fin reemplazar el consejo del mdico. Asegrese de hacerle al mdico cualquier pregunta que tenga. Document Revised: 02/17/2017 Document Reviewed: 02/27/2015 Elsevier Patient Education  2020 [employer]  

## 2019-11-17 ENCOUNTER — Other Ambulatory Visit: Payer: Self-pay | Admitting: Physician Assistant

## 2019-11-17 DIAGNOSIS — E559 Vitamin D deficiency, unspecified: Secondary | ICD-10-CM

## 2019-11-17 LAB — COMPREHENSIVE METABOLIC PANEL
ALT: 111 IU/L — ABNORMAL HIGH (ref 0–32)
AST: 66 IU/L — ABNORMAL HIGH (ref 0–40)
Albumin/Globulin Ratio: 1.5 (ref 1.2–2.2)
Albumin: 4.8 g/dL (ref 3.8–4.8)
Alkaline Phosphatase: 104 IU/L (ref 39–117)
BUN/Creatinine Ratio: 13 (ref 9–23)
BUN: 11 mg/dL (ref 6–24)
Bilirubin Total: 0.6 mg/dL (ref 0.0–1.2)
CO2: 25 mmol/L (ref 20–29)
Calcium: 10 mg/dL (ref 8.7–10.2)
Chloride: 102 mmol/L (ref 96–106)
Creatinine, Ser: 0.88 mg/dL (ref 0.57–1.00)
GFR calc Af Amer: 91 mL/min/{1.73_m2} (ref >59)
GFR calc non Af Amer: 79 mL/min/{1.73_m2} (ref >59)
Globulin, Total: 3.1 g/dL (ref 1.5–4.5)
Glucose: 89 mg/dL (ref 65–99)
Potassium: 4.1 mmol/L (ref 3.5–5.2)
Sodium: 143 mmol/L (ref 134–144)
Total Protein: 7.9 g/dL (ref 6.0–8.5)

## 2019-11-17 LAB — VITAMIN D 25 HYDROXY (VIT D DEFICIENCY, FRACTURES): Vit D, 25-Hydroxy: 14.8 ng/mL — ABNORMAL LOW (ref 30.0–100.0)

## 2019-11-17 LAB — HEMOGLOBIN A1C
Est. average glucose Bld gHb Est-mCnc: 123 mg/dL
Hgb A1c MFr Bld: 5.9 % — ABNORMAL HIGH (ref 4.8–5.6)

## 2019-11-17 MED ORDER — VITAMIN D (ERGOCALCIFEROL) 1.25 MG (50000 UNIT) PO CAPS: 50000.0000 [IU] | ORAL_CAPSULE | ORAL | 0 refills | Status: AC
